# Patient Record
Sex: Female | Born: 1960 | Race: White | Hispanic: No | Smoking: Never smoker
Health system: Southern US, Community
[De-identification: ages and names within clinical notes are randomized; demographics above are authoritative.]

## PROBLEM LIST (undated history)

## (undated) DIAGNOSIS — M1990 Unspecified osteoarthritis, unspecified site: Secondary | ICD-10-CM

## (undated) DIAGNOSIS — R112 Nausea with vomiting, unspecified: Secondary | ICD-10-CM

## (undated) DIAGNOSIS — Z9889 Other specified postprocedural states: Secondary | ICD-10-CM

## (undated) DIAGNOSIS — C801 Malignant (primary) neoplasm, unspecified: Secondary | ICD-10-CM

## (undated) DIAGNOSIS — T8859XA Other complications of anesthesia, initial encounter: Secondary | ICD-10-CM

## (undated) DIAGNOSIS — T4145XA Adverse effect of unspecified anesthetic, initial encounter: Secondary | ICD-10-CM

## (undated) DIAGNOSIS — F40240 Claustrophobia: Secondary | ICD-10-CM

## (undated) DIAGNOSIS — K5900 Constipation, unspecified: Secondary | ICD-10-CM

## (undated) HISTORY — PX: NASAL SEPTUM SURGERY: SHX37

## (undated) HISTORY — PX: OVARIAN CYST SURGERY: SHX726

## (undated) HISTORY — PX: NECK SURGERY: SHX720

## (undated) HISTORY — PX: COLONOSCOPY: SHX174

---

## 2003-03-19 ENCOUNTER — Inpatient Hospital Stay (HOSPITAL_COMMUNITY): Admission: EM | Admit: 2003-03-19 | Discharge: 2003-03-20 | Payer: Self-pay | Admitting: Internal Medicine

## 2003-09-09 HISTORY — PX: BACK SURGERY: SHX140

## 2004-02-22 ENCOUNTER — Encounter: Admission: RE | Admit: 2004-02-22 | Discharge: 2004-02-22 | Payer: Self-pay | Admitting: Unknown Physician Specialty

## 2004-03-04 ENCOUNTER — Ambulatory Visit (HOSPITAL_COMMUNITY): Admission: RE | Admit: 2004-03-04 | Discharge: 2004-03-05 | Payer: Self-pay | Admitting: Neurosurgery

## 2007-10-21 ENCOUNTER — Other Ambulatory Visit: Admission: RE | Admit: 2007-10-21 | Discharge: 2007-10-21 | Payer: Self-pay | Admitting: Obstetrics and Gynecology

## 2011-01-24 NOTE — H&P (Signed)
[name]                           ACCOUNT NO.:  [id number]   MEDICAL RECORD NO.:  [id number]                   PATIENT TYPE:  INP   LOCATION:  IC06                                 FACILITY:  [facility]   PHYSICIAN:  Donna Bernard, M.D.             DATE OF BIRTH:  05-22-1961   DATE OF ADMISSION:  03/19/2003  DATE OF DISCHARGE:                                HISTORY & PHYSICAL   CHIEF COMPLAINT:  Drug overdose.   OBJECTIVE:  This patient is a 50 year old white female with a history of  prior depression and anxiety who presented to the emergency room the day of  admission, tremendously sedated and minimally responsive initially.  First  the family, then the patient reports that she had an argument with her  husband.  They may be in the midst of splitting up, she is not sure.  She is  silent when I ask her if she has every tried to hurt herself before.  She  took a number of Ambien anywhere between 5 and 8.  There were no other  medications taken according to the family.  The patient, initially, was  pretty much out of it upon arrival with observation and alertness has  improved somewhat.  Oxygen saturation has been in good range.  Blood  pressure has been good.  The patient was felt to be significantly too  sedated to refer this evening for mental health consultation.  Compliance of  medications is uncertain.  The patient is on Paxil 37.5 mg daily.   FAMILY HISTORY:  Positive for diabetes, coronary artery disease,  hyperlipidemia.   PAST SURGICAL HISTORY:  Cyst rupture and laser surgery of the cervical  region.   ALLERGIES:  None known.   SOCIAL HISTORY:  The patient was divorced, now remarried.  No children.   REVIEW OF SYSTEMS:  Otherwise, negative.   PHYSICAL EXAMINATION:  VITAL SIGNS:  Blood pressure 110/74, pulse 95,  afebrile.  GENERAL APPEARANCE:  Very sedated.  Initially would respond only to sternal  rub.  Now opens eyes to voice.  HEENT:  Pupils somewhat  dilated and slightly slow to respond.  Extraocular  muscles intact.  The patient is very sedated, will answer in a somewhat  slurred voice appropriately.  She is not alert.  She is oriented x3.  Pharynx normal.  NECK:  Supple.  LUNGS:  Clear.  HEART:  Regular rate and rhythm.  ABDOMEN:  Soft, good bowel sounds.  NEUROLOGICAL:  Deep tendon reflexes intact.  Sensation difficult to asses.  Strength fair.  Alertness is noted.   IMPRESSION:  1. Intentional drug overdose.  2. Significant sedation secondary to #1.  3. History of anxiety and depression.   PLAN:  Admit for close observation, support, IV fluids, etc.  Mental health  referral.  Further orders as noted in the chart.  Donna Bernard, M.D.    WSL/MEDQ  D:  03/19/2003  T:  03/20/2003  Job:  [id number]

## 2011-01-24 NOTE — Discharge Summary (Signed)
   Stacey Rowe                           ACCOUNT NO.:  [id number]   MEDICAL RECORD NO.:  [id number]                   PATIENT TYPE:  INP   LOCATION:  IC06                                 FACILITY:  [facility]   PHYSICIAN:  Scott A. Gerda Diss, M.D.               DATE OF BIRTH:  1961-02-18   DATE OF ADMISSION:  03/19/2003  DATE OF DISCHARGE:  03/20/2003                                 DISCHARGE SUMMARY   ADMISSION DIAGNOSIS:  Drug overdose.   A 50 year old female with anxiety took some medication spontaneously, mainly  because she was distraught over a social situation.  She now in retrospect  says she was not trying to hurt herself; she denies trying to kill herself.  She was seen by mental health evaluation services who did not feel she  needed to be committed.  She agreed to follow up for her regimented  counseling and she was of total alertness and normal orientation on March 20, 2003 and was stable for discharge and stated very definitively that she was  not trying to hurt herself.                                               Scott A. Gerda Diss, M.D.    SAL/MEDQ  D:  04/25/2003  T:  04/25/2003  Job:  [id number]

## 2011-01-24 NOTE — Op Note (Signed)
Stacey Rowe, APPLEMAN                          ACCOUNT NO.:  [id number]   MEDICAL RECORD NO.:  [id number]                   PATIENT TYPE:  OIB   LOCATION:  2873                                 FACILITY:  [facility]   PHYSICIAN:  Hewitt Shorts, M.D.            DATE OF BIRTH:  [birth year]   DATE OF PROCEDURE:  03/04/2004  DATE OF DISCHARGE:                                 OPERATIVE REPORT   PREOPERATIVE DIAGNOSIS:  Left L4-5 lumbar disk herniation, lumbar  degenerative disk disease, lumbar spondylosis and lumbar radiculopathy.   POSTOPERATIVE DIAGNOSIS:  Left L4-5 lumbar disk herniation, lumbar  degenerative disk disease, lumbar spondylosis and lumbar radiculopathy.   OPERATION PERFORMED:  Left L4-5 lumbar laminotomy and microdiskectomy.   SURGEON:  Hewitt Shorts, M.D.   ANESTHESIA:  General endotracheal.   DESCRIPTION OF PROCEDURE:  The patient was brought to the operating room and  placed under general endotracheal anesthesia.  The patient was turned to a  prone position.  Lumbar region was prepped with DuraPrepand draped in  sterile fashion.  The midline was infiltrated with local anesthetic with  epinephrine.  X-ray was taken and the L4-5 level identified and then a  midline incision was made over the L4-5 level and carried down to the  subcutaneous tissue.  Bipolar cautery and electrocautery were used to  maintain hemostasis.  Dissection was carried down to the lumbar fascia which  was incised on the right side of the midline in the paraspinal muscles.  We  dissected the spinous process and lamina in subperiosteal fashion.  The L4-5  interlaminar space was identified and another x-ray was taken to confirm the  localization and then the operating microscope was draped and brought into  the field to provide additional magnification, illumination and  visualization and the remainder of the decompression was performed using  microdissection and microsurgical technique. A  laminotomy was performed  using the X-Max drill and Kerrison punches.  The ligamentum flavum was  carefully removed and we identified the thecal sac and nerve root.  These  were gently mobilized medially exposing the disk herniation.  The remaining  annular fibers were incised and diskectomy begun.  We removed several  fragments of disk herniation as well as a large amount of spondylitically  protruding disk material and we were able to decompress the thecal sac and  nerve root.  In the end, all loose fragments of disk material were removed  from both disk space and the epidural space and good decompression was  achieved. Hemostasis was established with the use of bipolar cautery.  Once  hemostasis was established and the diskectomy completed, the wound was  irrigated with bacitracin solution and then 2 mL of fentanyl and 80 mg of  Depo-Medrol were infused into the epidural space and then we proceeded with  closure.  The deep fascia was closed with interrupted #1 undyed Vicryl  sutures, the subcutaneous  and subcuticular layer were closed with  interrupted inverted 2-0 undyed Vicryl sutures and skin edges were  reapproximated with Dermabond.  The patient tolerated the procedure well.  The estimated blood loss for this procedure was 25 mL.  Sponge, needle and  instrument counts were correct.  Following surgery the patient was turned  back to supine position to be reversed from anesthetic, extubated and  transferred to recovery room for further care.                                               Hewitt Shorts, M.D.   RWN/MEDQ  D:  03/04/2004  T:  03/04/2004  Job:  [id number]

## 2013-07-27 ENCOUNTER — Encounter: Payer: Self-pay | Admitting: Family Medicine

## 2013-07-27 ENCOUNTER — Ambulatory Visit (INDEPENDENT_AMBULATORY_CARE_PROVIDER_SITE_OTHER): Payer: BC Managed Care – PPO | Admitting: Family Medicine

## 2013-07-27 VITALS — BP 122/78 | Ht 66.75 in | Wt 164.8 lb

## 2013-07-27 DIAGNOSIS — M7711 Lateral epicondylitis, right elbow: Secondary | ICD-10-CM

## 2013-07-27 DIAGNOSIS — M7710 Lateral epicondylitis, unspecified elbow: Secondary | ICD-10-CM

## 2013-07-27 NOTE — Patient Instructions (Signed)
Tennis Elbow  Your caregiver has diagnosed you with a condition often referred to as "tennis elbow." This results from small tears or soreness (inflammation) at the start (origin) of the extensor muscles of the forearm. Although the condition is often called tennis or golfer's elbow, it is caused by any repetitive action performed by your elbow.  HOME CARE INSTRUCTIONS   If the condition has been short lived, rest may be the only treatment required. Using your opposite hand or arm to perform the task may help. Even changing your grip may help rest the extremity. These may even prevent the condition from recurring.   Longer standing problems, however, will often be relieved faster by:   Using anti-inflammatory agents.   Applying ice packs for 30 minutes at the end of the working day, at bed time, or when activities are finished.   Your caregiver may also have you wear a splint or sling. This will allow the inflamed tendon to heal.  At times, steroid injections aided with a local anesthetic will be required along with splinting for 1 to 2 weeks. Two to three steroid injections will often solve the problem. In some long standing cases, the inflamed tendon does not respond to conservative (non-surgical) therapy. Then surgery may be required to repair it.  MAKE SURE YOU:    Understand these instructions.   Will watch your condition.   Will get help right away if you are not doing well or get worse.  Document Released: 08/25/2005 Document Revised: 11/17/2011 Document Reviewed: 04/12/2008  [employer] Patient Information 2014 [employer].

## 2013-07-27 NOTE — Progress Notes (Signed)
  Subjective:    Patient ID: Stacey Rowe, female    DOB: [birth year], 52 y.o.   MRN: [id number]  HPI Patient arrives with right elbow pain off and on for a year.  Off and on pain. 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 Lungs clear heart regular in rhythm. Right lateral elbow tenderness palpation good range of motion no deformity       Assessment & Plan:  Impression lateral epicondylitis discussed at length plan Voltaren twice a day with food. Form strap. Local measures discussed proper economic set the wrist discussed in encourage no [place] a rationale discussed. WSL

## 2014-10-18 ENCOUNTER — Encounter: Payer: Self-pay | Admitting: Family Medicine

## 2014-10-18 ENCOUNTER — Ambulatory Visit (INDEPENDENT_AMBULATORY_CARE_PROVIDER_SITE_OTHER): Payer: BLUE CROSS/BLUE SHIELD | Admitting: Family Medicine

## 2014-10-18 VITALS — BP 122/82 | Temp 98.2°F | Ht 66.75 in | Wt 161.0 lb

## 2014-10-18 DIAGNOSIS — B9689 Other specified bacterial agents as the cause of diseases classified elsewhere: Secondary | ICD-10-CM

## 2014-10-18 DIAGNOSIS — J019 Acute sinusitis, unspecified: Secondary | ICD-10-CM

## 2014-10-18 MED ORDER — LEVOFLOXACIN 500 MG PO TABS: 500.0000 mg | ORAL_TABLET | Freq: Every day | ORAL | Status: DC

## 2014-10-18 NOTE — Progress Notes (Signed)
   Subjective:    Patient ID: Stacey Rowe, female    DOB: 06/03/1961, 54 y.o.   MRN: [id number]  Sinusitis This is a new problem. Episode onset: Feb 2  The problem has been gradually worsening since onset. Maximum temperature: low grade  The fever has been present for less than 1 day. Associated symptoms include congestion, coughing, headaches, sinus pressure and a sore throat. Pertinent negatives include no ear pain or shortness of breath. (Chest tightness ) Past treatments include antibiotics, acetaminophen and oral decongestants. The treatment provided mild relief.   Moderate sinus pressure and congestion in the chest as well   Review of Systems  Constitutional: Negative for fever and activity change.  HENT: Positive for congestion, rhinorrhea, sinus pressure and sore throat. Negative for ear pain.   Eyes: Negative for discharge.  Respiratory: Positive for cough. Negative for shortness of breath and wheezing.   Cardiovascular: Negative for chest pain.  Neurological: Positive for headaches.       Objective:   Physical Exam  Constitutional: She appears well-developed.  HENT:  Head: Normocephalic.  Nose: Nose normal.  Mouth/Throat: Oropharynx is clear and moist. No oropharyngeal exudate.  Neck: Neck supple.  Cardiovascular: Normal rate and normal heart sounds.   No murmur heard. Pulmonary/Chest: Effort normal and breath sounds normal. She has no wheezes.  Lymphadenopathy:    She has no cervical adenopathy.  Skin: Skin is warm and dry.  Nursing note and vitals reviewed.  Her lungs are nice and clear I don't find any evidence of pneumonia going on patient not toxic we will cover her sinuses with antibiotics      Assessment & Plan:  Acute sinusitis antibiotics prescribed warning signs discuss

## 2016-01-03 DIAGNOSIS — Z008 Encounter for other general examination: Secondary | ICD-10-CM | POA: Diagnosis not present

## 2016-01-03 DIAGNOSIS — K5900 Constipation, unspecified: Secondary | ICD-10-CM | POA: Diagnosis not present

## 2016-01-03 DIAGNOSIS — Z1389 Encounter for screening for other disorder: Secondary | ICD-10-CM | POA: Diagnosis not present

## 2016-03-04 DIAGNOSIS — Z139 Encounter for screening, unspecified: Secondary | ICD-10-CM | POA: Diagnosis not present

## 2016-03-14 DIAGNOSIS — M25511 Pain in right shoulder: Secondary | ICD-10-CM | POA: Diagnosis not present

## 2016-03-14 DIAGNOSIS — M5030 Other cervical disc degeneration, unspecified cervical region: Secondary | ICD-10-CM | POA: Diagnosis not present

## 2016-03-18 DIAGNOSIS — R7301 Impaired fasting glucose: Secondary | ICD-10-CM | POA: Diagnosis not present

## 2016-03-19 DIAGNOSIS — Z01419 Encounter for gynecological examination (general) (routine) without abnormal findings: Secondary | ICD-10-CM | POA: Diagnosis not present

## 2016-03-19 DIAGNOSIS — Z6824 Body mass index (BMI) 24.0-24.9, adult: Secondary | ICD-10-CM | POA: Diagnosis not present

## 2016-03-24 DIAGNOSIS — M25511 Pain in right shoulder: Secondary | ICD-10-CM | POA: Diagnosis not present

## 2016-03-24 DIAGNOSIS — M5030 Other cervical disc degeneration, unspecified cervical region: Secondary | ICD-10-CM | POA: Diagnosis not present

## 2016-03-26 ENCOUNTER — Encounter (HOSPITAL_COMMUNITY): Payer: Self-pay | Admitting: Occupational Therapy

## 2016-03-26 ENCOUNTER — Ambulatory Visit (HOSPITAL_COMMUNITY): Payer: BLUE CROSS/BLUE SHIELD | Attending: Sports Medicine | Admitting: Occupational Therapy

## 2016-03-26 DIAGNOSIS — M25511 Pain in right shoulder: Secondary | ICD-10-CM | POA: Insufficient documentation

## 2016-03-26 DIAGNOSIS — R29898 Other symptoms and signs involving the musculoskeletal system: Secondary | ICD-10-CM | POA: Insufficient documentation

## 2016-03-26 NOTE — Patient Instructions (Signed)
1) Seated Row   Sit up straight with elbows by your sides. Pull back with shoulders/elbows, keeping forearms straight, as if pulling back on the reins of a horse. Squeeze shoulder blades together. Repeat _10-15__times, _1-2___sets/day    2) Shoulder Elevation    Sit up straight with arms by your sides. Slowly bring your shoulders up towards your ears. Repeat_10-15__times, __1-2__ sets/day    3) Shoulder Extension    Sit up straight with both arms by your side, draw your arms back behind your waist. Keep your elbows straight. Repeat __10-15__times, _1-2___sets/day.        

## 2016-03-26 NOTE — Therapy (Signed)
[facility] [address] [place], [place], [address] Phone: [phone]   Fax:  [phone]  Occupational Therapy Evaluation  Patient Details  Name: Stacey Rowe MRN: [id number] Date of Birth: 1961/08/29 Referring Provider: Dr. Wandra Feinstein  Encounter Date: 03/26/2016      OT End of Session - 03/26/16 1335    Visit Number 1   Number of Visits 8   Date for OT Re-Evaluation 04/25/16   Authorization Type BCBS   OT Start Time 1118   OT Stop Time 1153   OT Time Calculation (min) 35 min   Activity Tolerance Patient tolerated treatment well   Behavior During Therapy [facility] for tasks assessed/performed      History reviewed. No pertinent past medical history.  No past surgical history on file.  There were no vitals filed for this visit.      Subjective Assessment - 03/26/16 1331    Subjective  S: I just don't use this arm at all.    Pertinent History Pt is a 55 y/o female presenting with right shoulder pain of unknown origin that began in January. Pt reports she had intermittent shoulder pain prior to January however it was not significant. Pt has had 2 cortisone shots between January and July, however continues to experience pain with movement of the RUE. Pt was referred to occupational therapy for evaluation and treatment by Dr. Wandra Feinstein.    Special Tests FOTO Score: 54/100   Patient Stated Goals To be able to use my right arm.    Currently in Pain? No/denies           [facility] OT Assessment - 03/26/16 1119    Assessment   Diagnosis Right shoulder pain   Referring Provider Dr. Wandra Feinstein   Onset Date 09/09/15   Prior Therapy None   Precautions   Precautions None   Restrictions   Weight Bearing Restrictions No   Balance Screen   Has the patient fallen in the past 6 months No   Has the patient had a decrease in activity level because of a fear of falling?  No   Is the patient reluctant to leave their home  because of a fear of falling?  No   Home  Environment   Family/patient expects to be discharged to: Private residence   Prior Function   Level of Independence Independent   Vocation Full time employment   Conservation officer, historic buildings work   Leisure Research scientist (life sciences) with grandkids, reading    ADL   ADL comments Pt is having difficulty using RUE with all daily tasks including dressing, bathing-washing hair, household tasks, reaching and lifting lightweight objects   Written Expression   Dominant Hand Right   ROM / Strength   AROM / PROM / Strength AROM;PROM;Strength   Palpation   Palpation comment Moderate fascial restrictions in right upper arm and trapezius regions   AROM   Overall AROM Comments Assessed seated, ER/IR adducted   AROM Assessment Site Shoulder   Right/Left Shoulder Right   Right Shoulder Flexion 143 Degrees   Right Shoulder ABduction 155 Degrees   Right Shoulder Internal Rotation 90 Degrees   Right Shoulder External Rotation 52 Degrees   PROM   Overall PROM Comments Assessed in supine, ER/IR adducted   PROM Assessment Site Shoulder   Right/Left Shoulder Right   Right Shoulder Flexion 156 Degrees   Right Shoulder ABduction 130 Degrees   Right Shoulder Internal Rotation 90 Degrees   Right  Shoulder External Rotation 54 Degrees   Strength   Overall Strength Comments Assessed seated, ER/IR adducted   Strength Assessment Site Shoulder   Right/Left Shoulder Right   Right Shoulder Flexion 4/5   Right Shoulder ABduction 4/5   Right Shoulder Internal Rotation 4/5   Right Shoulder External Rotation 4-/5                         OT Education - 03/26/16 1145    Education provided Yes   Education Details scapular A/ROM   Person(s) Educated Patient   Methods Explanation;Demonstration;Handout   Comprehension Verbalized understanding;Returned demonstration          OT Short Term Goals - 03/26/16 1343    OT SHORT TERM GOAL #1   Title Pt will be  educated on HEP.    Time 4   Period Weeks   Status New   OT SHORT TERM GOAL #2   Title Pt will return to prior level of functioning and independence in all daily tasks using RUE as dominant.    Time 4   Period Weeks   Status New   OT SHORT TERM GOAL #3   Title Pt will decrease RUE pain to 3/10 or less with use during daily tasks.    Time 4   Period Weeks   Status New   OT SHORT TERM GOAL #4   Title Pt will decrease fascial restrictions in RUE to min amounts or less to increase mobility of RUE during functional tasks.    Time 4   Period Weeks   Status New   OT SHORT TERM GOAL #5   Title Pt will increase A/ROM of RUE to WNL to improve ability to wash hair using RUE.    Time 4   Period Weeks   Status New   Additional Short Term Goals   Additional Short Term Goals Yes   OT SHORT TERM GOAL #6   Title Pt will increase RUE strength to 4+/5 to increase ability to use RUE as dominant when completing yardwork tasks.    Time 4   Period Weeks   Status New                  Plan - 03/26/16 1335    Clinical Impression Statement A: Pt is a 55 y/o female presenting with increased pain and fascial restrictions, decreased range of motion and strength in RUE limiting ability to use as dominant during daily tasks. Pt is now completing all daily tasks with LUE due to pain with movement and use of RUE. Pt uses ice every night for pain management.    Rehab Potential Good   OT Frequency 2x / week   OT Duration 4 weeks   OT Treatment/Interventions Self-care/ADL training;Passive range of motion;Patient/family education;Cryotherapy;Electrical Stimulation;Moist Heat;Therapeutic exercise;Manual Therapy;Therapeutic activities   Plan P: Pt will benefit from skilled occupational therapy services to decrease pain and fascial restrictions, increase range of motion, strength, and functional use of RUE. Treatment plan: Myofascial release, manual therapy, P/ROM, A/ROM, general RUE strengthening, scapular  stability and strengthening, modalities as needed.    OT Home Exercise Plan scapular A/ROM exercises   Consulted and Agree with Plan of Care Patient      Patient will benefit from skilled therapeutic intervention in order to improve the following deficits and impairments:  Decreased strength, Pain, Impaired UE functional use, Increased fascial restricitons, Decreased range of motion, Impaired flexibility  Visit Diagnosis: Pain in  right shoulder  Other symptoms and signs involving the musculoskeletal system    Problem List Patient Active Problem List   Diagnosis Date Noted  . Lateral epicondylitis of right elbow 07/27/2013    Guadelupe Sabin, OTR/L  [phone]  03/26/2016, 1:47 PM  [facility] [address] [place], [place], [address] Phone: [phone]   Fax:  [phone]  Name: BREEANA FUGUA MRN: [id number] Date of Birth: 1960/10/02

## 2016-03-28 ENCOUNTER — Ambulatory Visit (HOSPITAL_COMMUNITY): Payer: BLUE CROSS/BLUE SHIELD | Admitting: Occupational Therapy

## 2016-03-28 ENCOUNTER — Encounter (HOSPITAL_COMMUNITY): Payer: Self-pay | Admitting: Occupational Therapy

## 2016-03-28 DIAGNOSIS — M25511 Pain in right shoulder: Secondary | ICD-10-CM | POA: Diagnosis not present

## 2016-03-28 DIAGNOSIS — R29898 Other symptoms and signs involving the musculoskeletal system: Secondary | ICD-10-CM

## 2016-03-28 NOTE — Therapy (Signed)
[facility] [place], [place], [address] Phone: [phone]   Fax:  [phone]  Occupational Therapy Treatment  Patient Details  Name: BARBIE GOVERT MRN: [id number] Date of Birth: August 25, 1961 Referring Provider: Dr. Wandra Feinstein  Encounter Date: 03/28/2016      OT End of Session - 03/28/16 1156    Visit Number 2   Number of Visits 8   Date for OT Re-Evaluation 04/25/16   Authorization Type BCBS   OT Start Time 1115   OT Stop Time 1155   OT Time Calculation (min) 40 min   Activity Tolerance Patient tolerated treatment well   Behavior During Therapy [facility] for tasks assessed/performed      History reviewed. No pertinent past medical history.  No past surgical history on file.  There were no vitals filed for this visit.      Subjective Assessment - 03/28/16 1117    Subjective  S: That exercise where you pull back hurts me.    Currently in Pain? No/denies            [facility] OT Assessment - 03/28/16 1117    Assessment   Diagnosis Right shoulder pain   Precautions   Precautions None                  OT Treatments/Exercises (OP) - 03/28/16 1118    Exercises   Exercises Shoulder   Shoulder Exercises: Supine   Protraction PROM;5 reps;AROM;10 reps   Horizontal ABduction PROM;5 reps;AROM;10 reps   External Rotation PROM;5 reps;AROM;10 reps   Internal Rotation PROM;5 reps;AROM;10 reps   Flexion PROM;5 reps;AROM;10 reps   ABduction PROM;5 reps;AROM;10 reps   Other Supine Exercises Serratus anterior punches 10X   Shoulder Exercises: Seated   Elevation AROM;10 reps   Extension AROM;10 reps   Row AROM;10 reps   Manual Therapy   Manual Therapy Myofascial release   Manual therapy comments Manual therapy completed prior to therapeutic exercise   Myofascial Release Myofascial release to right upper arm, trapezius, and scapularis regions to decrease pain and fascial restrictions and increase joint range  of motion                OT Education - 03/28/16 1155    Education provided Yes   Education Details A/ROM exercises-supine   Person(s) Educated Patient   Methods Explanation;Demonstration;Handout   Comprehension Verbalized understanding;Returned demonstration          OT Short Term Goals - 03/28/16 1158    OT SHORT TERM GOAL #1   Title Pt will be educated on HEP.    Time 4   Period Weeks   Status On-going   OT SHORT TERM GOAL #2   Title Pt will return to prior level of functioning and independence in all daily tasks using RUE as dominant.    Time 4   Period Weeks   Status On-going   OT SHORT TERM GOAL #3   Title Pt will decrease RUE pain to 3/10 or less with use during daily tasks.    Time 4   Period Weeks   Status On-going   OT SHORT TERM GOAL #4   Title Pt will decrease fascial restrictions in RUE to min amounts or less to increase mobility of RUE during functional tasks.    Time 4   Period Weeks   Status On-going   OT SHORT TERM GOAL #5   Title Pt will increase A/ROM of RUE to WNL to  improve ability to wash hair using RUE.    Time 4   Period Weeks   Status On-going   OT SHORT TERM GOAL #6   Title Pt will increase RUE strength to 4+/5 to increase ability to use RUE as dominant when completing yardwork tasks.    Time 4   Period Weeks   Status On-going                  Plan - 03/28/16 1156    Clinical Impression Statement A: Initiated myofascial release, manual therapy, P/ROM, A/ROM and therapy ball stretches this session. Pt reports minimal pain with A/ROM row. Pt with P/ROM and A/ROM WNL this session, verbal cuing for form and technique during exercises. Pt provided with A/ROM HEP to continue while on vacation this week.    Rehab Potential Good   OT Frequency 2x / week   OT Duration 4 weeks   OT Treatment/Interventions Self-care/ADL training;Passive range of motion;Patient/family education;Cryotherapy;Electrical Stimulation;Moist  Heat;Therapeutic exercise;Manual Therapy;Therapeutic activities   Plan P: Follow up on A/ROM, add exercises in standing. Add scapular theraband for scapular stability if pt able to tolerate.       Patient will benefit from skilled therapeutic intervention in order to improve the following deficits and impairments:  Decreased strength, Pain, Impaired UE functional use, Increased fascial restricitons, Decreased range of motion, Impaired flexibility  Visit Diagnosis: Pain in right shoulder  Other symptoms and signs involving the musculoskeletal system    Problem List Patient Active Problem List   Diagnosis Date Noted  . Lateral epicondylitis of right elbow 07/27/2013    Guadelupe Sabin, OTR/L  [phone] 03/28/2016, 11:58 AM  [facility] [address] [place], [place], [address] Phone: [phone]   Fax:  [phone]  Name: TANNYA SPRUILL MRN: [id number] Date of Birth: 18-Sep-1960

## 2016-03-28 NOTE — Patient Instructions (Signed)

## 2016-04-04 ENCOUNTER — Ambulatory Visit (HOSPITAL_COMMUNITY): Payer: BLUE CROSS/BLUE SHIELD

## 2016-04-04 ENCOUNTER — Encounter (HOSPITAL_COMMUNITY): Payer: Self-pay

## 2016-04-04 DIAGNOSIS — M25511 Pain in right shoulder: Secondary | ICD-10-CM | POA: Diagnosis not present

## 2016-04-04 DIAGNOSIS — R29898 Other symptoms and signs involving the musculoskeletal system: Secondary | ICD-10-CM | POA: Diagnosis not present

## 2016-04-04 NOTE — Therapy (Signed)
[place], [place], [address] Phone: [phone]   Fax:  [phone]  Occupational Therapy Treatment  Patient Details  Name: Stacey Rowe MRN: [id number] Date of Birth: [birth year] Referring Provider: Dr. Wandra Feinstein  Encounter Date: 04/04/2016      OT End of Session - 04/04/16 1055    Visit Number 3   Number of Visits 8   Date for OT Re-Evaluation 04/25/16   Authorization Type BCBS   OT Start Time [phone]   OT Stop Time 1030   OT Time Calculation (min) 42 min   Activity Tolerance Patient tolerated treatment well   Behavior During Therapy [facility] for tasks assessed/performed      History reviewed. No pertinent past medical history.  No past surgical history on file.  There were no vitals filed for this visit.      Subjective Assessment - 04/04/16 1009    Subjective  S: I did my exercises while I was on vacation. I did them in the pool.   Currently in Pain? Yes   Pain Score 2    Pain Location Shoulder   Pain Orientation Right   Pain Descriptors / Indicators Aching   Pain Type Acute pain   Pain Radiating Towards Down to deltoid   Pain Onset Yesterday   Pain Frequency Occasional   Aggravating Factors  Use and movement   Pain Relieving Factors Rest   Effect of Pain on Daily Activities None   Multiple Pain Sites No            [facility] OT Assessment - 04/04/16 1004      Assessment   Diagnosis Right shoulder pain     Precautions   Precautions None                  OT Treatments/Exercises (OP) - 04/04/16 1004      Exercises   Exercises Shoulder     Shoulder Exercises: Supine   Protraction PROM;5 reps;AROM;15 reps   Horizontal ABduction PROM;5 reps;AROM;15 reps   External Rotation PROM;5 reps;AROM;15 reps   Internal Rotation PROM;5 reps;AROM;15 reps   Flexion PROM;5 reps;AROM;15 reps   ABduction PROM;5 reps;AROM;15 reps     Shoulder Exercises: Standing   Protraction AROM;12  reps   Horizontal ABduction AROM;12 reps   External Rotation AROM;12 reps   Internal Rotation AROM;12 reps   Flexion AROM;12 reps   ABduction AROM;12 reps   Extension AROM;10 reps   Row AROM;10 reps   Shoulder Elevation AROM;10 reps     Shoulder Exercises: ROM/Strengthening   Proximal Shoulder Strengthening, Supine 10X no rest breaks     Modalities   Modalities Cryotherapy     Cryotherapy   Number Minutes Cryotherapy 10 Minutes   Cryotherapy Location Shoulder   Type of Cryotherapy Ice pack     Manual Therapy   Manual Therapy Myofascial release   Manual therapy comments Manual therapy completed prior to therapeutic exercise   Myofascial Release Myofascial release to right upper arm, trapezius, and scapularis regions to decrease pain and fascial restrictions and increase joint range of motion                OT Education - 04/04/16 1057    Education provided Yes   Education Details Recommended that patient continue completing HEP (A/ROM ) at home standing.    Person(s) Educated Patient   Methods Explanation   Comprehension Verbalized understanding  OT Short Term Goals - 03/28/16 1158      OT SHORT TERM GOAL #1   Title Pt will be educated on HEP.    Time 4   Period Weeks   Status On-going     OT SHORT TERM GOAL #2   Title Pt will return to prior level of functioning and independence in all daily tasks using RUE as dominant.    Time 4   Period Weeks   Status On-going     OT SHORT TERM GOAL #3   Title Pt will decrease RUE pain to 3/10 or less with use during daily tasks.    Time 4   Period Weeks   Status On-going     OT SHORT TERM GOAL #4   Title Pt will decrease fascial restrictions in RUE to min amounts or less to increase mobility of RUE during functional tasks.    Time 4   Period Weeks   Status On-going     OT SHORT TERM GOAL #5   Title Pt will increase A/ROM of RUE to WNL to improve ability to wash hair using RUE.    Time 4   Period  Weeks   Status On-going     OT SHORT TERM GOAL #6   Title Pt will increase RUE strength to 4+/5 to increase ability to use RUE as dominant when completing yardwork tasks.    Time 4   Period Weeks   Status On-going                  Plan - 04/04/16 1056    Clinical Impression Statement A: Pt completing exercises for HEP without issue. Added A/ROM standing. Unable to add scapular theraband due to time constraint. patient reports increased pain and burning and anterior deltoid region. Ice applied at end of session for pain management.    Plan P: Add scapular theraband for scapular stability if able to tolerate.       Patient will benefit from skilled therapeutic intervention in order to improve the following deficits and impairments:  Decreased strength, Pain, Impaired UE functional use, Increased fascial restricitons, Decreased range of motion, Impaired flexibility  Visit Diagnosis: Pain in right shoulder  Other symptoms and signs involving the musculoskeletal system    Problem List Patient Active Problem List   Diagnosis Date Noted  . Lateral epicondylitis of right elbow 07/27/2013   Ailene Ravel, OTR/L,CBIS  [phone]  04/04/2016, 11:04 AM  [facility] [address] [place], [place], [address] Phone: [phone]   Fax:  [phone]  Name: Stacey Rowe MRN: [id number] Date of Birth: 11/22/1960

## 2016-04-08 ENCOUNTER — Ambulatory Visit (HOSPITAL_COMMUNITY): Payer: BLUE CROSS/BLUE SHIELD | Attending: Sports Medicine

## 2016-04-08 ENCOUNTER — Encounter (HOSPITAL_COMMUNITY): Payer: Self-pay

## 2016-04-08 DIAGNOSIS — R29898 Other symptoms and signs involving the musculoskeletal system: Secondary | ICD-10-CM | POA: Diagnosis not present

## 2016-04-08 DIAGNOSIS — M25511 Pain in right shoulder: Secondary | ICD-10-CM | POA: Diagnosis not present

## 2016-04-08 NOTE — Patient Instructions (Signed)

## 2016-04-08 NOTE — Therapy (Signed)
[place], [place], [address] Phone: [phone]   Fax:  [phone]  Occupational Therapy Treatment  Patient Details  Name: Stacey Rowe MRN: [id number] Date of Birth: Mar 19, 1961 Referring Provider: Dr. Wandra Feinstein  Encounter Date: 04/08/2016      OT End of Session - 04/08/16 1157    Visit Number 4   Number of Visits 8   Date for OT Re-Evaluation 04/25/16   Authorization Type BCBS   OT Start Time 1117   OT Stop Time 1200   OT Time Calculation (min) 43 min   Activity Tolerance Patient tolerated treatment well   Behavior During Therapy [facility] for tasks assessed/performed      History reviewed. No pertinent past medical history.  No past surgical history on file.  There were no vitals filed for this visit.      Subjective Assessment - 04/08/16 1142    Subjective  S: Saturday was the best day. I didn't have any pain until later in the day.   Currently in Pain? Yes   Pain Score 1    Pain Location Shoulder   Pain Orientation Right   Pain Descriptors / Indicators Tender;Sore   Pain Type Acute pain                      OT Treatments/Exercises (OP) - 04/08/16 1133      Exercises   Exercises Shoulder     Shoulder Exercises: Supine   Protraction PROM;5 reps;AROM;15 reps   Horizontal ABduction PROM;5 reps;AROM;15 reps   External Rotation PROM;5 reps;AROM;15 reps   Internal Rotation PROM;5 reps;AROM;15 reps   Flexion PROM;5 reps;AROM;15 reps   ABduction PROM;5 reps;AROM;15 reps     Shoulder Exercises: Standing   Protraction AROM;15 reps   Horizontal ABduction AROM;15 reps   External Rotation AROM;15 reps   Internal Rotation AROM;15 reps   Flexion AROM;15 reps   ABduction AROM;15 reps   Extension Theraband;12 reps   Theraband Level (Shoulder Extension) Level 2 (Red)   Row Theraband;12 reps   Theraband Level (Shoulder Row) Level 2 (Red)   Retraction Theraband;12 reps   Theraband Level (Shoulder Retraction) Level 2 (Red)     Shoulder Exercises: ROM/Strengthening   UBE (Upper Arm Bike) Level 1' 2' flexion 2' reverse   Proximal Shoulder Strengthening, Supine 15X no rest breaks     Shoulder Exercises: Stretch   Corner Stretch 2 reps;10 seconds  doorway     Manual Therapy   Manual Therapy Myofascial release   Manual therapy comments Manual therapy completed prior to therapeutic exercise   Myofascial Release Myofascial release to right upper arm, trapezius, and scapularis regions to decrease pain and fascial restrictions and increase joint range of motion                OT Education - 04/08/16 1156    Education provided Yes   Education Details scapular theraband exercises (red)   Person(s) Educated Patient   Methods Explanation;Demonstration;Verbal cues;Handout   Comprehension Returned demonstration;Verbalized understanding          OT Short Term Goals - 03/28/16 1158      OT SHORT TERM GOAL #1   Title Pt will be educated on HEP.    Time 4   Period Weeks   Status On-going     OT SHORT TERM GOAL #2   Title Pt will return to prior level of functioning and independence in all daily tasks using  RUE as dominant.    Time 4   Period Weeks   Status On-going     OT SHORT TERM GOAL #3   Title Pt will decrease RUE pain to 3/10 or less with use during daily tasks.    Time 4   Period Weeks   Status On-going     OT SHORT TERM GOAL #4   Title Pt will decrease fascial restrictions in RUE to min amounts or less to increase mobility of RUE during functional tasks.    Time 4   Period Weeks   Status On-going     OT SHORT TERM GOAL #5   Title Pt will increase A/ROM of RUE to WNL to improve ability to wash hair using RUE.    Time 4   Period Weeks   Status On-going     OT SHORT TERM GOAL #6   Title Pt will increase RUE strength to 4+/5 to increase ability to use RUE as dominant when completing yardwork tasks.    Time 4   Period Weeks    Status On-going                  Plan - 04/08/16 1201    Clinical Impression Statement A: Added scapular theraband exercises and patient completed with great form. Exercises added to HEP. Pt reports that this session her shoulder felt stronger and stable versus last session.    Plan P: Continue with A/ROM exercises. Increase to 12 repetitions if able.       Patient will benefit from skilled therapeutic intervention in order to improve the following deficits and impairments:  Decreased strength, Pain, Impaired UE functional use, Increased fascial restricitons, Decreased range of motion, Impaired flexibility  Visit Diagnosis: Other symptoms and signs involving the musculoskeletal system  Pain in right shoulder    Problem List Patient Active Problem List   Diagnosis Date Noted  . Lateral epicondylitis of right elbow 07/27/2013   Ailene Ravel, OTR/L,CBIS  [phone]  04/08/2016, 12:42 PM  [facility] [address] [place], [place], [address] Phone: [phone]   Fax:  [phone]  Name: Stacey Rowe MRN: [id number] Date of Birth: 1960-09-20

## 2016-04-10 ENCOUNTER — Encounter (HOSPITAL_COMMUNITY): Payer: Self-pay | Admitting: Occupational Therapy

## 2016-04-10 ENCOUNTER — Ambulatory Visit (HOSPITAL_COMMUNITY): Payer: BLUE CROSS/BLUE SHIELD | Admitting: Occupational Therapy

## 2016-04-10 DIAGNOSIS — R29898 Other symptoms and signs involving the musculoskeletal system: Secondary | ICD-10-CM | POA: Diagnosis not present

## 2016-04-10 DIAGNOSIS — M25511 Pain in right shoulder: Secondary | ICD-10-CM

## 2016-04-10 NOTE — Therapy (Signed)
[place], [place], [address] Phone: [phone]   Fax:  [phone]  Occupational Therapy Treatment  Patient Details  Name: Stacey Rowe MRN: [id number] Date of Birth: 30-Mar-1961 Referring Provider: Dr. Wandra Feinstein  Encounter Date: 04/10/2016      OT End of Session - 04/10/16 1241    Visit Number 5   Number of Visits 8   Date for OT Re-Evaluation 04/25/16   Authorization Type BCBS   OT Start Time 1115   OT Stop Time 1156   OT Time Calculation (min) 41 min   Activity Tolerance Patient tolerated treatment well   Behavior During Therapy [facility] for tasks assessed/performed      History reviewed. No pertinent past medical history.  No past surgical history on file.  There were no vitals filed for this visit.      Subjective Assessment - 04/10/16 1111    Subjective  S: Walking through the plant I felt a little pain throbbing in that arm.    Currently in Pain? No/denies            [facility] OT Assessment - 04/10/16 1111      Assessment   Diagnosis Right shoulder pain     Precautions   Precautions None                  OT Treatments/Exercises (OP) - 04/10/16 1119      Exercises   Exercises Shoulder     Shoulder Exercises: Supine   Protraction PROM;5 reps;AROM;15 reps   Horizontal ABduction PROM;5 reps;AROM;15 reps   External Rotation PROM;5 reps;AROM;15 reps   Internal Rotation PROM;5 reps;AROM;15 reps   Flexion PROM;5 reps;AROM;15 reps   ABduction PROM;5 reps;AROM;15 reps     Shoulder Exercises: Standing   Protraction AROM;15 reps;Theraband;10 reps   Theraband Level (Shoulder Protraction) Level 2 (Red)   Horizontal ABduction AROM;15 reps;Theraband;10 reps   Theraband Level (Shoulder Horizontal ABduction) Level 2 (Red)   External Rotation AROM;15 reps;Theraband;10 reps   Theraband Level (Shoulder External Rotation) Level 2 (Red)   Internal Rotation AROM;15 reps;Theraband;10  reps   Theraband Level (Shoulder Internal Rotation) Level 2 (Red)   Flexion AROM;15 reps;Theraband;10 reps   Theraband Level (Shoulder Flexion) Level 2 (Red)   ABduction AROM;15 reps   Extension Theraband;12 reps   Theraband Level (Shoulder Extension) Level 2 (Red)   Row Theraband;12 reps   Theraband Level (Shoulder Row) Level 2 (Red)   Retraction Theraband;12 reps   Theraband Level (Shoulder Retraction) Level 2 (Red)     Shoulder Exercises: ROM/Strengthening   X to V Arms 10X   Proximal Shoulder Strengthening, Supine 15X no rest breaks   Proximal Shoulder Strengthening, Seated 10X each no rest breaks   Ball on Wall 1' flexion 1' abduction   Rhythmic Stabilization, Supine Completed with shoulder at 60, 90, and 120 degrees, min difficulty. 25X     Manual Therapy   Manual Therapy Myofascial release   Manual therapy comments Manual therapy completed prior to therapeutic exercise   Myofascial Release Myofascial release to right upper arm, trapezius, and scapularis regions to decrease pain and fascial restrictions and increase joint range of motion                  OT Short Term Goals - 03/28/16 1158      OT SHORT TERM GOAL #1   Title Pt will be educated on HEP.    Time 4   Period  Weeks   Status On-going     OT SHORT TERM GOAL #2   Title Pt will return to prior level of functioning and independence in all daily tasks using RUE as dominant.    Time 4   Period Weeks   Status On-going     OT SHORT TERM GOAL #3   Title Pt will decrease RUE pain to 3/10 or less with use during daily tasks.    Time 4   Period Weeks   Status On-going     OT SHORT TERM GOAL #4   Title Pt will decrease fascial restrictions in RUE to min amounts or less to increase mobility of RUE during functional tasks.    Time 4   Period Weeks   Status On-going     OT SHORT TERM GOAL #5   Title Pt will increase A/ROM of RUE to WNL to improve ability to wash hair using RUE.    Time 4   Period Weeks    Status On-going     OT SHORT TERM GOAL #6   Title Pt will increase RUE strength to 4+/5 to increase ability to use RUE as dominant when completing yardwork tasks.    Time 4   Period Weeks   Status On-going                  Plan - 04/10/16 1241    Clinical Impression Statement A: Added theraband shoulder exercises this session, pt with no pain during exercises, some fatigue at end. Good form for all exercises, intermittent verbal cuing for initial completion. Pt reports she has occasional pain between visits.    Rehab Potential Good   OT Frequency 2x / week   OT Duration 4 weeks   OT Treatment/Interventions Self-care/ADL training;Passive range of motion;Patient/family education;Cryotherapy;Electrical Stimulation;Moist Heat;Therapeutic exercise;Manual Therapy;Therapeutic activities   Plan P: Add sidelying A/ROM, follow up on scapular theraband HEP if pt has attempted.    Consulted and Agree with Plan of Care Patient      Patient will benefit from skilled therapeutic intervention in order to improve the following deficits and impairments:  Decreased strength, Pain, Impaired UE functional use, Increased fascial restricitons, Decreased range of motion, Impaired flexibility  Visit Diagnosis: Other symptoms and signs involving the musculoskeletal system  Pain in right shoulder    Problem List Patient Active Problem List   Diagnosis Date Noted  . Lateral epicondylitis of right elbow 07/27/2013    Guadelupe Sabin, OTR/L  [phone] 04/10/2016, 12:43 PM  [facility] [address] [place], [place], [address] Phone: [phone]   Fax:  [phone]  Name: Stacey Rowe MRN: [id number] Date of Birth: August 18, 1961

## 2016-04-15 ENCOUNTER — Encounter (HOSPITAL_COMMUNITY): Payer: Self-pay

## 2016-04-15 ENCOUNTER — Ambulatory Visit (HOSPITAL_COMMUNITY): Payer: BLUE CROSS/BLUE SHIELD

## 2016-04-15 DIAGNOSIS — M25511 Pain in right shoulder: Secondary | ICD-10-CM | POA: Diagnosis not present

## 2016-04-15 DIAGNOSIS — R29898 Other symptoms and signs involving the musculoskeletal system: Secondary | ICD-10-CM | POA: Diagnosis not present

## 2016-04-15 NOTE — Therapy (Signed)
[place], [place], [address] Phone: [phone]   Fax:  [phone]  Occupational Therapy Treatment  Patient Details  Name: Stacey Rowe MRN: [id number] Date of Birth: [birth year] Referring Provider: Dr. Wandra Feinstein  Encounter Date: 04/15/2016      OT End of Session - 04/15/16 0944    Visit Number 6   Number of Visits 8   Date for OT Re-Evaluation 04/25/16   Authorization Type BCBS   OT Start Time 0900   OT Stop Time 0940   OT Time Calculation (min) 40 min   Activity Tolerance Patient tolerated treatment well   Behavior During Therapy [facility] for tasks assessed/performed      History reviewed. No pertinent past medical history.  No past surgical history on file.  There were no vitals filed for this visit.      Subjective Assessment - 04/15/16 0921    Subjective  S: this weekend it was very painful. I didn't do anything different.    Currently in Pain? Yes   Pain Score 2    Pain Location Shoulder   Pain Orientation Right   Pain Descriptors / Indicators Tender;Sore   Pain Type Acute pain   Pain Radiating Towards down arm   Pain Onset In the past 7 days   Pain Frequency Occasional   Aggravating Factors  use and movement   Pain Relieving Factors rest    Effect of Pain on Daily Activities None   Multiple Pain Sites No            [facility] OT Assessment - 04/15/16 0924      Assessment   Diagnosis Right shoulder pain     Precautions   Precautions None                  OT Treatments/Exercises (OP) - 04/15/16 0924      Exercises   Exercises Shoulder     Shoulder Exercises: Supine   Protraction PROM;5 reps;AROM;15 reps   Horizontal ABduction PROM;5 reps;AROM;15 reps   External Rotation PROM;5 reps;AROM;15 reps   Internal Rotation PROM;5 reps;AROM;15 reps   Flexion PROM;5 reps;AROM;15 reps   ABduction PROM;5 reps;AROM;15 reps     Shoulder Exercises: Sidelying   External  Rotation AROM;12 reps   Internal Rotation AROM;12 reps   Flexion AROM;12 reps   ABduction AROM;12 reps     Shoulder Exercises: Standing   Protraction AROM;15 reps   Horizontal ABduction AROM;15 reps   External Rotation AROM;15 reps   Internal Rotation AROM;15 reps   Flexion AROM;15 reps   ABduction AROM;15 reps     Shoulder Exercises: ROM/Strengthening   Other ROM/Strengthening Exercises shoulder rolls backward/forward 12X     Manual Therapy   Manual Therapy Myofascial release   Manual therapy comments Manual therapy completed prior to therapeutic exercise   Myofascial Release Myofascial release to right upper arm, trapezius, and scapularis regions to decrease pain and fascial restrictions and increase joint range of motion                  OT Short Term Goals - 03/28/16 1158      OT SHORT TERM GOAL #1   Title Pt will be educated on HEP.    Time 4   Period Weeks   Status On-going     OT SHORT TERM GOAL #2   Title Pt will return to prior level of functioning and independence in all daily tasks using RUE  as dominant.    Time 4   Period Weeks   Status On-going     OT SHORT TERM GOAL #3   Title Pt will decrease RUE pain to 3/10 or less with use during daily tasks.    Time 4   Period Weeks   Status On-going     OT SHORT TERM GOAL #4   Title Pt will decrease fascial restrictions in RUE to min amounts or less to increase mobility of RUE during functional tasks.    Time 4   Period Weeks   Status On-going     OT SHORT TERM GOAL #5   Title Pt will increase A/ROM of RUE to WNL to improve ability to wash hair using RUE.    Time 4   Period Weeks   Status On-going     OT SHORT TERM GOAL #6   Title Pt will increase RUE strength to 4+/5 to increase ability to use RUE as dominant when completing yardwork tasks.    Time 4   Period Weeks   Status On-going                  Plan - 04/15/16 0944    Clinical Impression Statement A: Pt completed sidelying  exercises to increase scapular stability and strength. Greatest difficulty with sidelying flexion. VC for form and technique as needed.    Plan P: Reassess/measurements for MD appointment on Monday. Patient would like to wait and speak with MD before scheduling more appointments.       Patient will benefit from skilled therapeutic intervention in order to improve the following deficits and impairments:  Decreased strength, Pain, Impaired UE functional use, Increased fascial restricitons, Decreased range of motion, Impaired flexibility  Visit Diagnosis: Other symptoms and signs involving the musculoskeletal system  Pain in right shoulder    Problem List Patient Active Problem List   Diagnosis Date Noted  . Lateral epicondylitis of right elbow 07/27/2013   Ailene Ravel, OTR/L,CBIS  [phone]  04/15/2016, 9:46 AM  [facility] [address] [place], [place], [address] Phone: [phone]   Fax:  [phone]  Name: Stacey Rowe MRN: [id number] Date of Birth: 22-May-1961

## 2016-04-18 ENCOUNTER — Encounter (HOSPITAL_COMMUNITY): Payer: Self-pay | Admitting: Occupational Therapy

## 2016-04-18 ENCOUNTER — Ambulatory Visit (HOSPITAL_COMMUNITY): Payer: BLUE CROSS/BLUE SHIELD | Admitting: Occupational Therapy

## 2016-04-18 DIAGNOSIS — R29898 Other symptoms and signs involving the musculoskeletal system: Secondary | ICD-10-CM

## 2016-04-18 DIAGNOSIS — M25511 Pain in right shoulder: Secondary | ICD-10-CM | POA: Diagnosis not present

## 2016-04-18 NOTE — Therapy (Signed)
[place], [place], [address] Phone: [phone]   Fax:  [phone]  Occupational Therapy Reassessment, Treatment, and Discharge  Patient Details  Name: Stacey Rowe MRN: [id number] Date of Birth: 25-Oct-1960 Referring Provider: Dr. Wandra Feinstein  Encounter Date: 04/18/2016      OT End of Session - 04/18/16 1157    Visit Number 7   Number of Visits 8   Date for OT Re-Evaluation 04/25/16   Authorization Type BCBS   OT Start Time 1114   OT Stop Time 1157   OT Time Calculation (min) 43 min   Activity Tolerance Patient tolerated treatment well   Behavior During Therapy [facility] for tasks assessed/performed      History reviewed. No pertinent past medical history.  No past surgical history on file.  There were no vitals filed for this visit.      Subjective Assessment - 04/18/16 1113    Subjective  S: I'm having a good day today. No pain yet.    Currently in Pain? No/denies           [facility] OT Assessment - 04/18/16 1113      Assessment   Diagnosis Right shoulder pain     Precautions   Precautions None     ROM / Strength   AROM / PROM / Strength AROM;PROM;Strength     Palpation   Palpation comment Minimal fascial restrictions in right upper arm and trapezius regions     AROM   Overall AROM Comments Assessed seated, ER/IR adducted   AROM Assessment Site Shoulder   Right/Left Shoulder Right   Right Shoulder Flexion 174 Degrees  143 previous   Right Shoulder ABduction 180 Degrees  155 previous   Right Shoulder Internal Rotation 90 Degrees  same as previous   Right Shoulder External Rotation 71 Degrees  52 previous     PROM   Overall PROM Comments Assessed in supine, ER/IR adducted   PROM Assessment Site Shoulder   Right/Left Shoulder Right   Right Shoulder Flexion 170 Degrees  156 previous   Right Shoulder ABduction 180 Degrees  130 previous   Right Shoulder Internal Rotation 90  Degrees  same as previous   Right Shoulder External Rotation 68 Degrees  54 previous     Strength   Overall Strength Comments Assessed seated, ER/IR adducted   Strength Assessment Site Shoulder   Right/Left Shoulder Right   Right Shoulder Flexion 5/5  4/5 previous   Right Shoulder ABduction 5/5  4/5 previous   Right Shoulder Internal Rotation 5/5  4/5 previous   Right Shoulder External Rotation 5/5  4-/5 previous                  OT Treatments/Exercises (OP) - 04/18/16 1116      Exercises   Exercises Shoulder     Shoulder Exercises: Supine   Protraction PROM;5 reps   Horizontal ABduction PROM;5 reps   External Rotation PROM;5 reps   Internal Rotation PROM;5 reps   Flexion PROM;5 reps   ABduction PROM;5 reps     Shoulder Exercises: Standing   Protraction AROM;15 reps   Horizontal ABduction AROM;15 reps   External Rotation AROM;15 reps   Internal Rotation AROM;15 reps   Flexion AROM;15 reps   ABduction AROM;15 reps   Extension Theraband;15 reps   Theraband Level (Shoulder Extension) Level 3 (Green)   Row [employer] reps   Theraband Level (Shoulder Row) Level 3 (Green)   Retraction  Theraband;15 reps   Theraband Level (Shoulder Retraction) Level 3 (Green)     Shoulder Exercises: ROM/Strengthening   UBE (Upper Arm Bike) Level 1' 3' flexion 3' reverse   X to V Arms 15X   Other ROM/Strengthening Exercises green theraband strengthening exercises: horizontal ab/adduction, ER/IR, PNF patterns, 10X each     Manual Therapy   Manual Therapy Myofascial release   Manual therapy comments Manual therapy completed prior to therapeutic exercise   Myofascial Release Myofascial release to right upper arm, trapezius, and scapularis regions to decrease pain and fascial restrictions and increase joint range of motion               OT Education - 04/18/16 1138    Education provided Yes   Education Details green theraband strengthening    Person(s) Educated  Patient   Methods Explanation;Demonstration;Handout   Comprehension Verbalized understanding;Returned demonstration          OT Short Term Goals - 04/18/16 1133      OT SHORT TERM GOAL #1   Title Pt will be educated on HEP.    Time 4   Period Weeks   Status Achieved     OT SHORT TERM GOAL #2   Title Pt will return to prior level of functioning and independence in all daily tasks using RUE as dominant.    Time 4   Period Weeks   Status Achieved     OT SHORT TERM GOAL #3   Title Pt will decrease RUE pain to 3/10 or less with use during daily tasks.    Time 4   Period Weeks   Status Partially Met     OT SHORT TERM GOAL #4   Title Pt will decrease fascial restrictions in RUE to min amounts or less to increase mobility of RUE during functional tasks.    Time 4   Period Weeks   Status Achieved     OT SHORT TERM GOAL #5   Title Pt will increase A/ROM of RUE to WNL to improve ability to wash hair using RUE.    Time 4   Period Weeks   Status Achieved     OT SHORT TERM GOAL #6   Title Pt will increase RUE strength to 4+/5 to increase ability to use RUE as dominant when completing yardwork tasks.    Time 4   Period Weeks   Status Achieved                  Plan - 04/18/16 1158    Clinical Impression Statement A: Reassessment completed this session, pt has met 5/6 STGs and partially met an additional 1/6 STGs. Pt reports she is now able to use her RUE for all ADL and work tasks with no pain. Pt reports she now only has pain when she is sitting still or lying down, with the occasional shooting pain during the day that does not last long. Pt has made excellent progress with her ROM and strength and is agreeable to discharge today.    Plan P: Discharge pt.       Patient will benefit from skilled therapeutic intervention in order to improve the following deficits and impairments:     Visit Diagnosis: Other symptoms and signs involving the musculoskeletal  system  Pain in right shoulder    Problem List Patient Active Problem List   Diagnosis Date Noted  . Lateral epicondylitis of right elbow 07/27/2013     Guadelupe Rowe, OTR/L  [phone]  04/18/2016, 12:01 PM  [place] [address] [place], [place], [address] Phone: [phone]   Fax:  [phone]  Name: Stacey Rowe MRN: [id number] Date of Birth: [birth year]    OCCUPATIONAL THERAPY DISCHARGE SUMMARY  Visits from Start of Care: 7  Current functional level related to goals / functional outcomes: See above. Pt has made excellent progress with ROM and strength. Pt is now using RUE as dominant during ADL, work, and leisure tasks.    Remaining deficits: Pt continues to have intermittent shooting pain in the RUE. Pt describes as "sharp nerve pain." Pt reports this has decreased since beginning therapy, however she does still experience pain mostly at night and when sitting in her recliner, with occasional pains at other times during the day.    Education / Equipment: Pt provided with green theraband strengthening exercises, reviewed A/ROM HEP and scapular theraband HEP.  Plan: Patient agrees to discharge.  Patient goals were met. Patient is being discharged due to meeting the stated rehab goals.  ?????

## 2016-04-18 NOTE — Patient Instructions (Signed)
Strengthening: Chest Pull - Resisted   Hold Theraband in front of body with hands about shoulder width a part. Pull band a part and back together slowly. Repeat __10-15__ times. Complete __1__ set(s) per session.. Repeat _1-2___ session(s) per day.  http://orth.exer.us/926   Copyright  VHI. All rights reserved.   PNF Strengthening: Resisted   Standing with resistive band around each hand, bring right arm up and away, thumb back. Repeat _10-15___ times per set. Do _1___ sets per session. Do __1-2__ sessions per day.                           Resisted External Rotation: in Neutral - Bilateral   Sit or stand, tubing in both hands, elbows at sides, bent to 90, forearms forward. Pinch shoulder blades together and rotate forearms out. Keep elbows at sides. Repeat _10-15___ times per set. Do _1___ sets per session. Do _1-2___ sessions per day.  http://orth.exer.us/966   Copyright  VHI. All rights reserved.   PNF Strengthening: Resisted   Standing, hold resistive band above head. Bring right arm down and out from side. Repeat _10-15___ times per set. Do __1__ sets per session. Do _1-2___ sessions per day.  http://orth.exer.us/922   Copyright  VHI. All rights reserved.  

## 2016-04-21 DIAGNOSIS — M5030 Other cervical disc degeneration, unspecified cervical region: Secondary | ICD-10-CM | POA: Diagnosis not present

## 2016-04-21 DIAGNOSIS — M7501 Adhesive capsulitis of right shoulder: Secondary | ICD-10-CM | POA: Diagnosis not present

## 2016-04-21 DIAGNOSIS — M25511 Pain in right shoulder: Secondary | ICD-10-CM | POA: Diagnosis not present

## 2016-04-22 ENCOUNTER — Encounter (HOSPITAL_COMMUNITY): Payer: Self-pay

## 2016-04-24 ENCOUNTER — Encounter (HOSPITAL_COMMUNITY): Payer: Self-pay | Admitting: Occupational Therapy

## 2016-04-25 ENCOUNTER — Other Ambulatory Visit: Payer: Self-pay | Admitting: Sports Medicine

## 2016-04-25 DIAGNOSIS — M25511 Pain in right shoulder: Secondary | ICD-10-CM

## 2016-05-07 ENCOUNTER — Ambulatory Visit
Admission: RE | Admit: 2016-05-07 | Discharge: 2016-05-07 | Disposition: A | Discharge status: Home / Self Care | Payer: BLUE CROSS/BLUE SHIELD | Source: Ambulatory Visit | Attending: Sports Medicine | Admitting: Sports Medicine

## 2016-05-07 DIAGNOSIS — M25511 Pain in right shoulder: Secondary | ICD-10-CM

## 2016-05-07 MED ORDER — IOPAMIDOL (ISOVUE-M 200) INJECTION 41%
12.0000 mL | Freq: Once | INTRAMUSCULAR | Status: AC
Start: 2016-05-07 — End: 2016-05-07
  Administered 2016-05-07: 12 mL via INTRA_ARTICULAR

## 2016-05-19 DIAGNOSIS — M7501 Adhesive capsulitis of right shoulder: Secondary | ICD-10-CM | POA: Diagnosis not present

## 2016-05-19 DIAGNOSIS — M5030 Other cervical disc degeneration, unspecified cervical region: Secondary | ICD-10-CM | POA: Diagnosis not present

## 2016-05-19 DIAGNOSIS — M25511 Pain in right shoulder: Secondary | ICD-10-CM | POA: Diagnosis not present

## 2016-05-22 ENCOUNTER — Other Ambulatory Visit (HOSPITAL_COMMUNITY): Payer: Self-pay | Admitting: Sports Medicine

## 2016-05-22 DIAGNOSIS — M542 Cervicalgia: Secondary | ICD-10-CM

## 2016-06-02 ENCOUNTER — Encounter (HOSPITAL_COMMUNITY): Payer: Self-pay | Admitting: *Deleted

## 2016-06-03 ENCOUNTER — Ambulatory Visit (HOSPITAL_COMMUNITY)
Admission: RE | Admit: 2016-06-03 | Discharge: 2016-06-03 | Disposition: A | Discharge status: Home / Self Care | Payer: BLUE CROSS/BLUE SHIELD | Source: Ambulatory Visit | Attending: Sports Medicine | Admitting: Sports Medicine

## 2016-06-03 ENCOUNTER — Encounter (HOSPITAL_COMMUNITY)
Admission: RE | Disposition: A | Discharge status: Home / Self Care | Payer: Self-pay | Source: Ambulatory Visit | Attending: Sports Medicine

## 2016-06-03 ENCOUNTER — Encounter (HOSPITAL_COMMUNITY): Payer: Self-pay | Admitting: Anesthesiology

## 2016-06-03 ENCOUNTER — Ambulatory Visit (HOSPITAL_COMMUNITY): Payer: BLUE CROSS/BLUE SHIELD | Admitting: Anesthesiology

## 2016-06-03 DIAGNOSIS — M50322 Other cervical disc degeneration at C5-C6 level: Secondary | ICD-10-CM | POA: Insufficient documentation

## 2016-06-03 DIAGNOSIS — M4802 Spinal stenosis, cervical region: Secondary | ICD-10-CM | POA: Insufficient documentation

## 2016-06-03 DIAGNOSIS — M542 Cervicalgia: Secondary | ICD-10-CM

## 2016-06-03 HISTORY — PX: RADIOLOGY WITH ANESTHESIA: SHX6223

## 2016-06-03 HISTORY — DX: Other specified postprocedural states: Z98.890

## 2016-06-03 HISTORY — DX: Malignant (primary) neoplasm, unspecified: C80.1

## 2016-06-03 HISTORY — DX: Constipation, unspecified: K59.00

## 2016-06-03 HISTORY — DX: Nausea with vomiting, unspecified: R11.2

## 2016-06-03 HISTORY — DX: Unspecified osteoarthritis, unspecified site: M19.90

## 2016-06-03 HISTORY — DX: Claustrophobia: F40.240

## 2016-06-03 HISTORY — DX: Other complications of anesthesia, initial encounter: T88.59XA

## 2016-06-03 HISTORY — DX: Adverse effect of unspecified anesthetic, initial encounter: T41.45XA

## 2016-06-03 SURGERY — RADIOLOGY WITH ANESTHESIA
Anesthesia: General

## 2016-06-03 MED ORDER — OXYCODONE HCL 5 MG PO TABS: 5.0000 mg | ORAL_TABLET | Freq: Once | ORAL | Status: DC | PRN

## 2016-06-03 MED ORDER — ONDANSETRON HCL 4 MG/2ML IJ SOLN
4.0000 mg | Freq: Once | INTRAMUSCULAR | Status: AC | PRN
  Administered 2016-06-03: 4 mg via INTRAVENOUS

## 2016-06-03 MED ORDER — OXYCODONE HCL 5 MG/5ML PO SOLN: 5.0000 mg | Freq: Once | ORAL | Status: DC | PRN

## 2016-06-03 MED ORDER — LACTATED RINGERS IV SOLN
INTRAVENOUS | Status: DC
Start: 2016-06-03 — End: 2016-06-03
  Administered 2016-06-03: 07:00:00 via INTRAVENOUS

## 2016-06-03 MED ORDER — ONDANSETRON HCL 4 MG/2ML IJ SOLN
INTRAMUSCULAR | Status: AC
  Filled 2016-06-03: qty 2

## 2016-06-03 MED ORDER — FENTANYL CITRATE (PF) 100 MCG/2ML IJ SOLN: 25.0000 ug | INTRAMUSCULAR | Status: DC | PRN

## 2016-06-03 NOTE — Progress Notes (Signed)
Report given to maria rn as caregiver 

## 2016-06-03 NOTE — Anesthesia Preprocedure Evaluation (Addendum)
Anesthesia Evaluation  Patient identified by MRN, date of birth, ID band Patient awake    Reviewed: Allergy & Precautions, NPO status , Patient's Chart, lab work & pertinent test results  History of Anesthesia Complications (+) PONV and history of anesthetic complications  Airway Mallampati: II  TM Distance: >3 FB Neck ROM: Full    Dental  (+) Teeth Intact, Dental Advisory Given   Pulmonary    breath sounds clear to auscultation       Cardiovascular  Rhythm:Regular Rate:Normal     Neuro/Psych    GI/Hepatic   Endo/Other    Renal/GU      Musculoskeletal   Abdominal   Peds  Hematology   Anesthesia Other Findings   Reproductive/Obstetrics                            Anesthesia Physical Anesthesia Plan  ASA: II  Anesthesia Plan: General   Post-op Pain Management:    Induction: Intravenous  Airway Management Planned: LMA  Additional Equipment:   Intra-op Plan:   Post-operative Plan:   Informed Consent: I have reviewed the patients History and Physical, chart, labs and discussed the procedure including the risks, benefits and alternatives for the proposed anesthesia with the patient or authorized representative who has indicated his/her understanding and acceptance.   Dental advisory given and Dental Advisory Given  Plan Discussed with: CRNA and Anesthesiologist  Anesthesia Plan Comments:        Anesthesia Quick Evaluation

## 2016-06-04 ENCOUNTER — Encounter (HOSPITAL_COMMUNITY): Payer: Self-pay | Admitting: Radiology

## 2016-06-17 DIAGNOSIS — M4802 Spinal stenosis, cervical region: Secondary | ICD-10-CM | POA: Diagnosis not present

## 2016-06-18 DIAGNOSIS — Z23 Encounter for immunization: Secondary | ICD-10-CM | POA: Diagnosis not present

## 2016-07-09 DIAGNOSIS — Z01812 Encounter for preprocedural laboratory examination: Secondary | ICD-10-CM | POA: Diagnosis not present

## 2016-07-09 DIAGNOSIS — M4802 Spinal stenosis, cervical region: Secondary | ICD-10-CM | POA: Diagnosis not present

## 2016-07-17 DIAGNOSIS — M47892 Other spondylosis, cervical region: Secondary | ICD-10-CM | POA: Diagnosis not present

## 2016-07-17 DIAGNOSIS — M4802 Spinal stenosis, cervical region: Secondary | ICD-10-CM | POA: Diagnosis not present

## 2016-08-25 DIAGNOSIS — M4802 Spinal stenosis, cervical region: Secondary | ICD-10-CM | POA: Diagnosis not present

## 2016-11-28 DIAGNOSIS — M4802 Spinal stenosis, cervical region: Secondary | ICD-10-CM | POA: Diagnosis not present

## 2016-12-25 DIAGNOSIS — Z1389 Encounter for screening for other disorder: Secondary | ICD-10-CM | POA: Diagnosis not present

## 2016-12-25 DIAGNOSIS — K5900 Constipation, unspecified: Secondary | ICD-10-CM | POA: Diagnosis not present

## 2016-12-25 DIAGNOSIS — Z719 Counseling, unspecified: Secondary | ICD-10-CM | POA: Diagnosis not present

## 2016-12-25 DIAGNOSIS — Z008 Encounter for other general examination: Secondary | ICD-10-CM | POA: Diagnosis not present

## 2017-01-01 DIAGNOSIS — Z1231 Encounter for screening mammogram for malignant neoplasm of breast: Secondary | ICD-10-CM | POA: Diagnosis not present

## 2017-02-24 DIAGNOSIS — Z139 Encounter for screening, unspecified: Secondary | ICD-10-CM | POA: Diagnosis not present

## 2017-02-24 DIAGNOSIS — R7301 Impaired fasting glucose: Secondary | ICD-10-CM | POA: Diagnosis not present

## 2017-03-10 DIAGNOSIS — Z008 Encounter for other general examination: Secondary | ICD-10-CM | POA: Diagnosis not present

## 2017-03-10 DIAGNOSIS — K5900 Constipation, unspecified: Secondary | ICD-10-CM | POA: Diagnosis not present

## 2017-03-10 DIAGNOSIS — Z719 Counseling, unspecified: Secondary | ICD-10-CM | POA: Diagnosis not present

## 2017-04-16 IMAGING — MR MR SHOULDER*R* W/CM
6 series · 40 of 40 positions shown · IV contrast (agent unspecified)
Comparison: Injection images dated 05/07/2016

CLINICAL DATA: Right shoulder pain.

EXAM:
MR ARTHROGRAM OF THE RIGHT SHOULDER
TECHNIQUE: Multiplanar, multisequence MR imaging of the right shoulder was
performed following the administration of intra-articular contrast.
CONTRAST:  See Injection Documentation.

[Series 4: T1 fat-sat · axial · 4.0mm · 0.23mm/px · z∈[-2,+78]mm · 8 of 18 slices shown (1 of 4)]
[im 1/18]
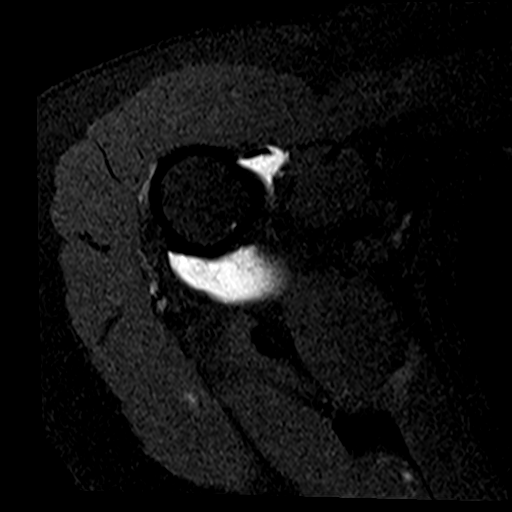
[im 3/18]
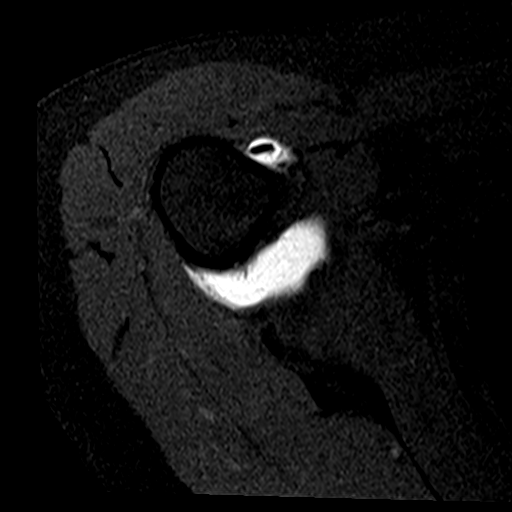
[im 5/18]
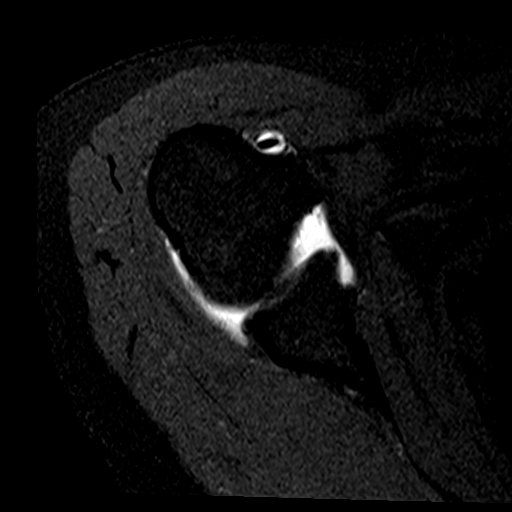
[im 8/18]
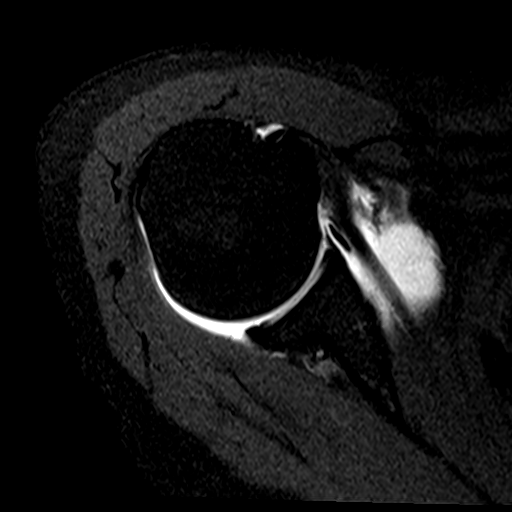
[im 10/18]
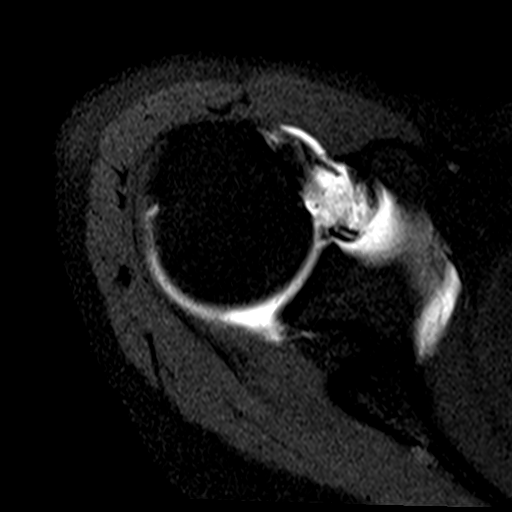
[im 13/18]
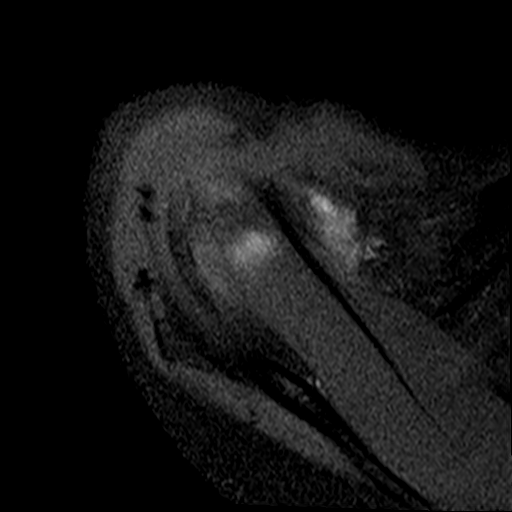
[im 15/18]
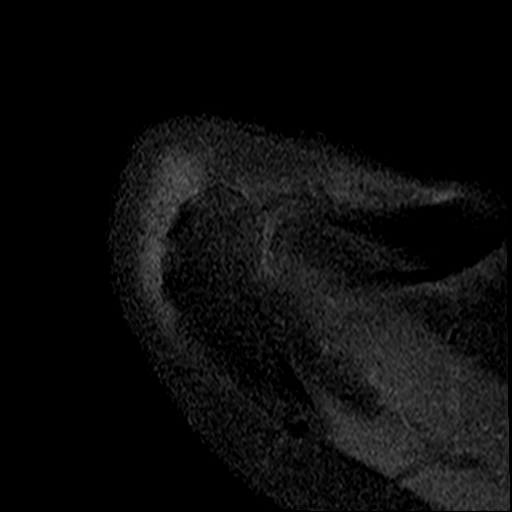
[im 18/18]
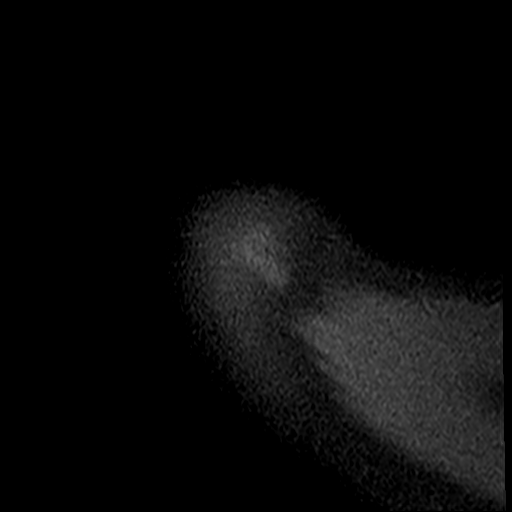

[Series 5: T2 fat-sat · oblique · 4.0mm · 0.55mm/px · 8 of 17 slices shown (1 of 2)]
[im 1/17]
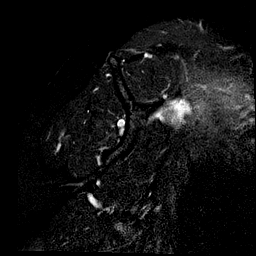
[im 3/17]
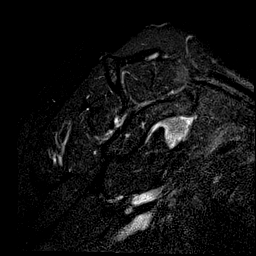
[im 5/17]
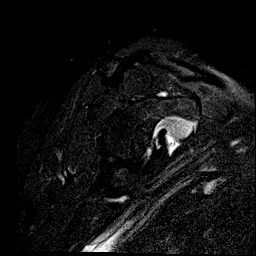
[im 7/17]
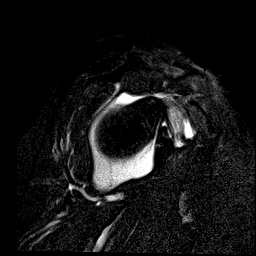
[im 10/17]
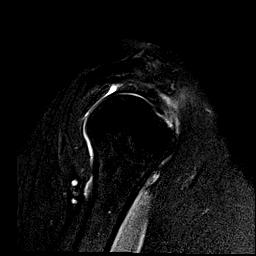
[im 12/17]
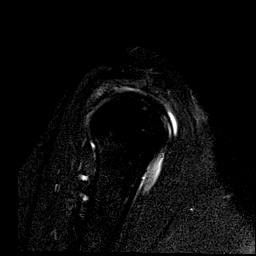
[im 14/17]
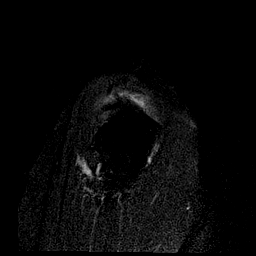
[im 17/17]
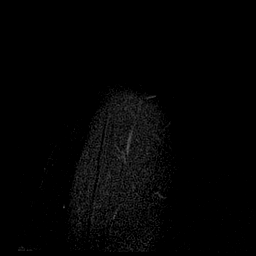

[Series 6: T1 fat-sat · oblique · 4.0mm · 0.55mm/px · 6 of 16 slices shown (2 of 4)]
[im 1/16]
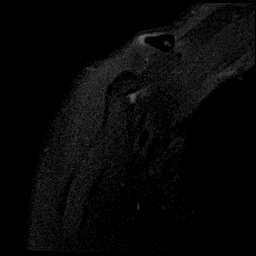
[im 4/16]
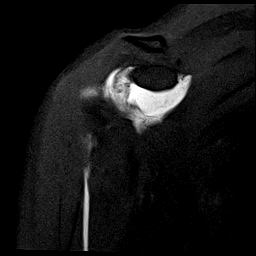
[im 7/16]
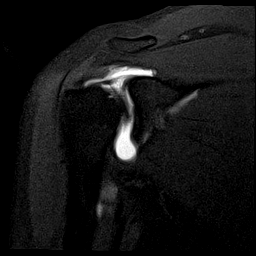
[im 10/16]
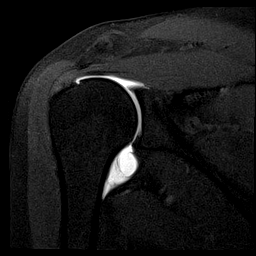
[im 13/16]
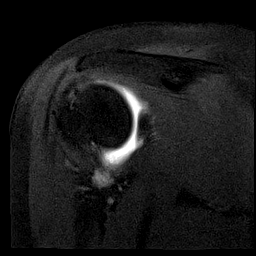
[im 16/16]
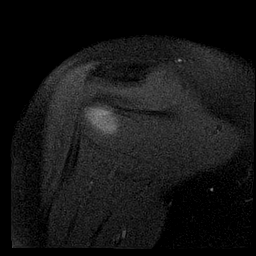

[Series 7: T1 fat-sat · oblique · 4.0mm · 0.44mm/px · 6 of 16 slices shown (3 of 4)]
[im 1/16]
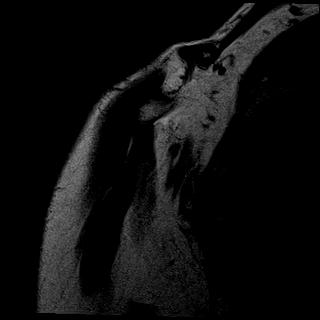
[im 4/16]
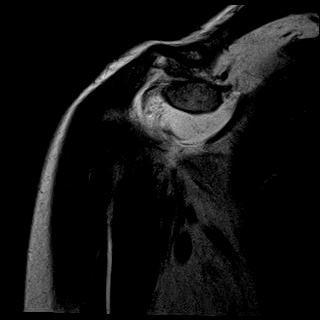
[im 7/16]
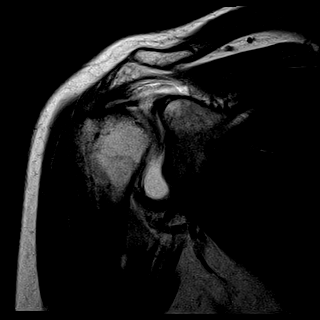
[im 10/16]
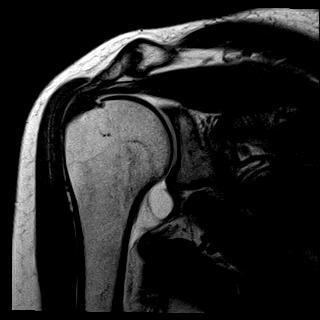
[im 13/16]
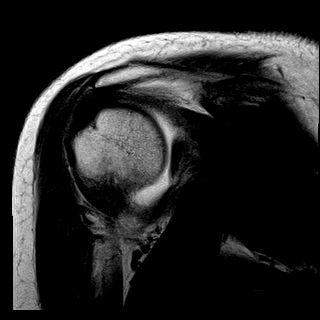
[im 16/16]
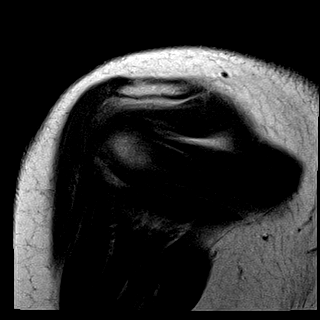

[Series 8: T2 fat-sat · oblique · 4.0mm · 0.55mm/px · 6 of 16 slices shown (2 of 2)]
[im 1/16]
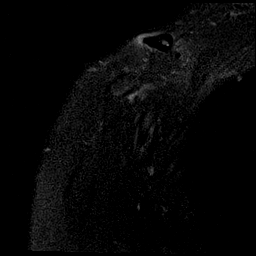
[im 4/16]
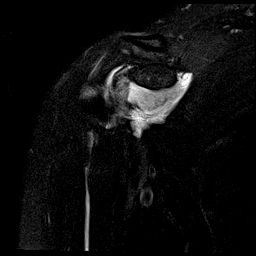
[im 7/16]
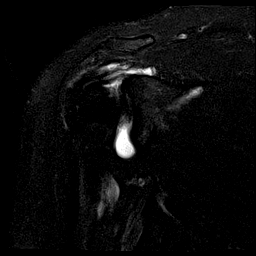
[im 10/16]
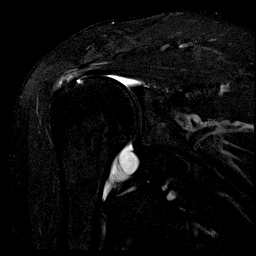
[im 13/16]
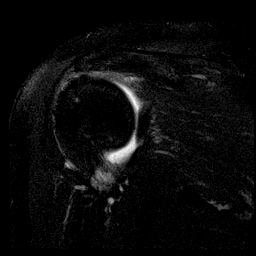
[im 16/16]
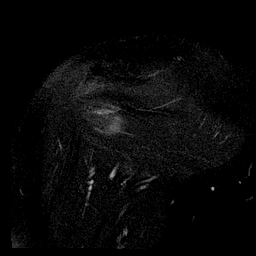

[Series 11: T1 fat-sat · sagittal · 4.0mm · 0.59mm/px · 6 of 16 slices shown (4 of 4)]
[im 1/16]
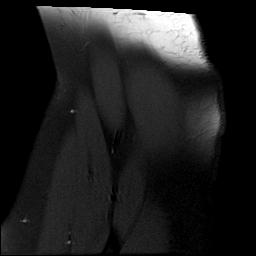
[im 4/16]
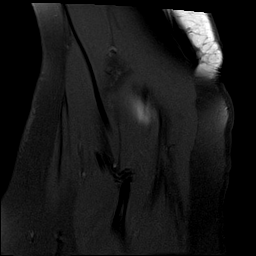
[im 7/16]
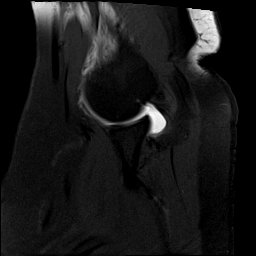
[im 10/16]
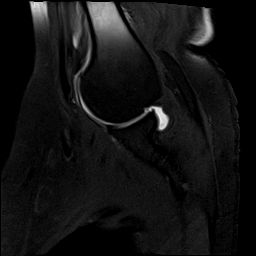
[im 13/16]
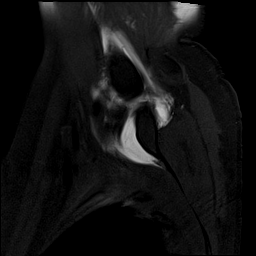
[im 16/16]
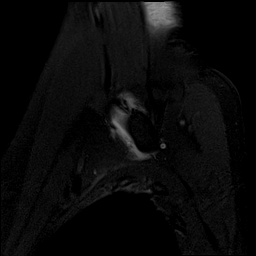

[40 of 40 positions shown; findings below may reference images not displayed]

FINDINGS: Small amount of iatrogenic contrast in the soft tissues of the
rotator cuff interval.

Rotator cuff: Intact.

Muscles: Normal.

Biceps long head: Properly located and intact.

Acromioclavicular Joint: Minimal degenerative changes. Type 2
acromion. No bursitis.

Glenohumeral Joint: Normal.

Labrum: Normal.

Bones: Normal.
IMPRESSION: Essentially normal right shoulder arthrogram.

## 2017-04-30 DIAGNOSIS — Z6825 Body mass index (BMI) 25.0-25.9, adult: Secondary | ICD-10-CM | POA: Diagnosis not present

## 2017-04-30 DIAGNOSIS — Z01419 Encounter for gynecological examination (general) (routine) without abnormal findings: Secondary | ICD-10-CM | POA: Diagnosis not present

## 2017-11-16 ENCOUNTER — Ambulatory Visit: Payer: BLUE CROSS/BLUE SHIELD | Admitting: Nurse Practitioner

## 2017-11-16 ENCOUNTER — Encounter: Payer: Self-pay | Admitting: Nurse Practitioner

## 2017-11-16 VITALS — BP 100/80 | Temp 99.2°F | Ht 66.75 in | Wt 163.0 lb

## 2017-11-16 DIAGNOSIS — J019 Acute sinusitis, unspecified: Secondary | ICD-10-CM

## 2017-11-16 DIAGNOSIS — B9689 Other specified bacterial agents as the cause of diseases classified elsewhere: Secondary | ICD-10-CM | POA: Diagnosis not present

## 2017-11-16 MED ORDER — AZITHROMYCIN 250 MG PO TABS: ORAL_TABLET | ORAL | 0 refills | Status: DC

## 2017-11-16 MED ORDER — HYDROCODONE-HOMATROPINE 5-1.5 MG/5ML PO SYRP: 5.0000 mL | ORAL_SOLUTION | ORAL | 0 refills | Status: DC | PRN

## 2017-11-16 NOTE — Progress Notes (Signed)
   Subjective:    Patient ID: Stacey Rowe, female    DOB: May 01, 1961, 57 y.o.   MRN: [id number]  HPI Patient is here today with complaints of a cough,congestion,fever, runny nose,sore throat,headache since last Wednesday. She has been taking mucinex cold and flu day and night rest. Review of Systems Presents for complaints of illness that began on 3/6.  Low-grade fever.  Sore throat has resolved.  Facial/frontal area headache.  Runny nose.  Frequent cough especially at nighttime.  Producing dark yellow mucus.  Slightly better with Mucinex.  Unable to rest last night due to cough.  No wheezing.  Gets regular preventive health physicals with her gynecologist.    Objective:   Physical Exam NAD.  Alert, oriented.  TMs retracted bilateral, no erythema.  Pharynx posterior erythematous with yellowish PND noted.  Neck supple with mild soft anterior adenopathy.  Lungs clear.  Heart regular rate and rhythm.       Assessment & Plan:  Acute bacterial rhinosinusitis  Meds ordered this encounter  Medications  . azithromycin (ZITHROMAX Z-PAK) 250 MG tablet    Sig: Take 2 tablets (500 mg) on  Day 1,  followed by 1 tablet (250 mg) once daily on Days 2 through 5.    Dispense:  6 each    Refill:  0    Order Specific Question:   Supervising Provider    Answer:   Mikey Kirschner [[id number]]  . HYDROcodone-homatropine (HYCODAN) 5-1.5 MG/5ML syrup    Sig: Take 5 mLs by mouth every 4 (four) hours as needed.    Dispense:  90 mL    Refill:  0    Order Specific Question:   Supervising Provider    Answer:   Mikey Kirschner [[id number]]   Hydrocodone for nighttime use.  Drowsiness precautions.  Continue OTC meds as directed.  Warning signs reviewed.  Call back by the end of the week if no improvement, sooner if worse.

## 2017-11-23 ENCOUNTER — Ambulatory Visit: Payer: BLUE CROSS/BLUE SHIELD | Admitting: Family Medicine

## 2017-11-23 ENCOUNTER — Encounter: Payer: Self-pay | Admitting: Family Medicine

## 2017-11-23 VITALS — BP 118/86 | Temp 97.6°F | Ht 66.75 in | Wt 162.0 lb

## 2017-11-23 DIAGNOSIS — B9689 Other specified bacterial agents as the cause of diseases classified elsewhere: Secondary | ICD-10-CM

## 2017-11-23 DIAGNOSIS — J019 Acute sinusitis, unspecified: Secondary | ICD-10-CM | POA: Diagnosis not present

## 2017-11-23 DIAGNOSIS — J4521 Mild intermittent asthma with (acute) exacerbation: Secondary | ICD-10-CM | POA: Diagnosis not present

## 2017-11-23 MED ORDER — AMOXICILLIN 500 MG PO CAPS: 500.0000 mg | ORAL_CAPSULE | Freq: Three times a day (TID) | ORAL | 0 refills | Status: AC

## 2017-11-23 MED ORDER — PREDNISONE 20 MG PO TABS: ORAL_TABLET | ORAL | 0 refills | Status: DC

## 2017-11-23 MED ORDER — ALBUTEROL SULFATE HFA 108 (90 BASE) MCG/ACT IN AERS: INHALATION_SPRAY | RESPIRATORY_TRACT | 0 refills | Status: DC

## 2017-11-23 MED ORDER — AMOXICILLIN 500 MG PO CAPS: 500.0000 mg | ORAL_CAPSULE | Freq: Three times a day (TID) | ORAL | 0 refills | Status: DC

## 2017-11-23 NOTE — Progress Notes (Signed)
   Subjective:    Patient ID: Stacey Rowe, female    DOB: [birth year], 59 y.o.   MRN: [id number]  HPI Patient is here today with complaints of fever,cough,congestion,headache,sinus drainage,runny nose. She was seen last week by Hoyle Sauer and feels she is not much better. She states she was given a zpak and finished it on March 15. She also was given hycodan and thinks she is allergic as she states she was itching and does not recall a rash,but did vomit it up as well.  Temp did come back ghis weekend  Temp now down a bit   No hx of wheezing in past but has now developed it, worse zt night   Non smoker  n     Review of Systems No headache, no major weight loss or weight gain, no chest pain no back pain abdominal pain no change in bowel habits complete ROS otherwise negative     Objective:   Physical Exam   impression alert active good hydration positive nasal congestion.  Positive frontal tendernessLungs expiratory wheezes throughout all lung fields no inspiratory crackles no tachypnea      Assessment & Plan:  1 impression rhinosinusitis/bronchitis with exacerbation of reactive airways.  Albuterol use discussed.  And demonstrated.  Prednisone taper.  Antibiotics prescribed symptom care discussed

## 2017-12-15 ENCOUNTER — Other Ambulatory Visit: Payer: Self-pay | Admitting: Family Medicine

## 2018-02-04 DIAGNOSIS — Z1231 Encounter for screening mammogram for malignant neoplasm of breast: Secondary | ICD-10-CM | POA: Diagnosis not present

## 2018-04-13 DIAGNOSIS — R7301 Impaired fasting glucose: Secondary | ICD-10-CM | POA: Diagnosis not present

## 2018-04-13 DIAGNOSIS — Z139 Encounter for screening, unspecified: Secondary | ICD-10-CM | POA: Diagnosis not present

## 2018-04-27 DIAGNOSIS — Z719 Counseling, unspecified: Secondary | ICD-10-CM | POA: Diagnosis not present

## 2018-04-27 DIAGNOSIS — Z1389 Encounter for screening for other disorder: Secondary | ICD-10-CM | POA: Diagnosis not present

## 2018-04-27 DIAGNOSIS — E785 Hyperlipidemia, unspecified: Secondary | ICD-10-CM | POA: Diagnosis not present

## 2018-04-27 DIAGNOSIS — K5900 Constipation, unspecified: Secondary | ICD-10-CM | POA: Diagnosis not present

## 2018-04-27 DIAGNOSIS — Z008 Encounter for other general examination: Secondary | ICD-10-CM | POA: Diagnosis not present

## 2018-05-06 DIAGNOSIS — Z8042 Family history of malignant neoplasm of prostate: Secondary | ICD-10-CM | POA: Diagnosis not present

## 2018-05-06 DIAGNOSIS — Z113 Encounter for screening for infections with a predominantly sexual mode of transmission: Secondary | ICD-10-CM | POA: Diagnosis not present

## 2018-05-06 DIAGNOSIS — Z803 Family history of malignant neoplasm of breast: Secondary | ICD-10-CM | POA: Diagnosis not present

## 2018-05-06 DIAGNOSIS — Z6826 Body mass index (BMI) 26.0-26.9, adult: Secondary | ICD-10-CM | POA: Diagnosis not present

## 2018-05-06 DIAGNOSIS — Z01419 Encounter for gynecological examination (general) (routine) without abnormal findings: Secondary | ICD-10-CM | POA: Diagnosis not present

## 2018-05-06 DIAGNOSIS — N760 Acute vaginitis: Secondary | ICD-10-CM | POA: Diagnosis not present

## 2018-06-10 DIAGNOSIS — Z23 Encounter for immunization: Secondary | ICD-10-CM | POA: Diagnosis not present

## 2018-06-17 DIAGNOSIS — Z809 Family history of malignant neoplasm, unspecified: Secondary | ICD-10-CM | POA: Diagnosis not present

## 2018-06-17 DIAGNOSIS — Z13820 Encounter for screening for osteoporosis: Secondary | ICD-10-CM | POA: Diagnosis not present

## 2018-10-28 DIAGNOSIS — Z008 Encounter for other general examination: Secondary | ICD-10-CM | POA: Diagnosis not present

## 2018-10-28 DIAGNOSIS — Z1389 Encounter for screening for other disorder: Secondary | ICD-10-CM | POA: Diagnosis not present

## 2018-10-28 DIAGNOSIS — Z719 Counseling, unspecified: Secondary | ICD-10-CM | POA: Diagnosis not present

## 2018-10-28 DIAGNOSIS — Z7189 Other specified counseling: Secondary | ICD-10-CM | POA: Diagnosis not present

## 2019-04-19 DIAGNOSIS — E785 Hyperlipidemia, unspecified: Secondary | ICD-10-CM | POA: Diagnosis not present

## 2019-04-19 DIAGNOSIS — Z0189 Encounter for other specified special examinations: Secondary | ICD-10-CM | POA: Diagnosis not present

## 2019-04-19 DIAGNOSIS — Z139 Encounter for screening, unspecified: Secondary | ICD-10-CM | POA: Diagnosis not present

## 2019-04-19 DIAGNOSIS — Z0130 Encounter for examination of blood pressure without abnormal findings: Secondary | ICD-10-CM | POA: Diagnosis not present

## 2019-05-03 DIAGNOSIS — E785 Hyperlipidemia, unspecified: Secondary | ICD-10-CM | POA: Diagnosis not present

## 2019-05-24 DIAGNOSIS — Z6827 Body mass index (BMI) 27.0-27.9, adult: Secondary | ICD-10-CM | POA: Diagnosis not present

## 2019-05-24 DIAGNOSIS — Z01419 Encounter for gynecological examination (general) (routine) without abnormal findings: Secondary | ICD-10-CM | POA: Diagnosis not present

## 2019-06-07 DIAGNOSIS — Z0130 Encounter for examination of blood pressure without abnormal findings: Secondary | ICD-10-CM | POA: Diagnosis not present

## 2019-06-07 DIAGNOSIS — Z139 Encounter for screening, unspecified: Secondary | ICD-10-CM | POA: Diagnosis not present

## 2019-06-09 DIAGNOSIS — F331 Major depressive disorder, recurrent, moderate: Secondary | ICD-10-CM | POA: Diagnosis not present

## 2019-06-09 DIAGNOSIS — F411 Generalized anxiety disorder: Secondary | ICD-10-CM | POA: Diagnosis not present

## 2019-06-21 DIAGNOSIS — F331 Major depressive disorder, recurrent, moderate: Secondary | ICD-10-CM | POA: Diagnosis not present

## 2019-06-21 DIAGNOSIS — F411 Generalized anxiety disorder: Secondary | ICD-10-CM | POA: Diagnosis not present

## 2019-06-21 DIAGNOSIS — Z23 Encounter for immunization: Secondary | ICD-10-CM | POA: Diagnosis not present

## 2019-07-12 DIAGNOSIS — F331 Major depressive disorder, recurrent, moderate: Secondary | ICD-10-CM | POA: Diagnosis not present

## 2019-07-12 DIAGNOSIS — F411 Generalized anxiety disorder: Secondary | ICD-10-CM | POA: Diagnosis not present

## 2019-08-02 DIAGNOSIS — F411 Generalized anxiety disorder: Secondary | ICD-10-CM | POA: Diagnosis not present

## 2019-08-02 DIAGNOSIS — F331 Major depressive disorder, recurrent, moderate: Secondary | ICD-10-CM | POA: Diagnosis not present

## 2019-08-19 ENCOUNTER — Other Ambulatory Visit: Payer: Self-pay

## 2019-08-19 ENCOUNTER — Ambulatory Visit: Payer: BLUE CROSS/BLUE SHIELD | Admitting: Psychiatry

## 2019-08-19 ENCOUNTER — Ambulatory Visit (INDEPENDENT_AMBULATORY_CARE_PROVIDER_SITE_OTHER): Payer: BC Managed Care – PPO | Admitting: Psychiatry

## 2019-08-19 ENCOUNTER — Encounter: Payer: Self-pay | Admitting: Psychiatry

## 2019-08-19 DIAGNOSIS — F411 Generalized anxiety disorder: Secondary | ICD-10-CM | POA: Diagnosis not present

## 2019-08-19 NOTE — Progress Notes (Signed)
[facility] Counsel[facility] Initial Adult Exam  Name: Stacey Rowe Date: 08/19/2019 MRN: [id number] DOB: Mar 25, 1961 PCP: Kathyrn Drown, MD  Time spent: 60 minutes  10:00am to 11:00am   Guardian/Payee:  patent  Paperwork requested:  No   Reason for Visit /Presenting Problem/ Symptoms : anxious, some anger, "I saw NP at work and was put on meds Escitalopram and I think that has helped.  Used to be angry all the time. "some anxiety with panic", claustrophobic  Mental Status Exam:   Appearance:   Casual     Behavior:  Appropriate and Sharing  Motor:  Normal  Speech/Language:   Normal Rate  Affect:  anxious  Mood:  anxious  Thought process:  normal  Thought content:    WNL  Sensory/Perceptual disturbances:    WNL  Orientation:  oriented to person, place, time/date, situation, day of week, month of year and year  Attention:  Good  Concentration:  Fair  Memory:  [place] of knowledge:   Good  Insight:    Good  Judgment:   Good  Impulse Control:  Fair   Reported Symptoms:  See symptoms above  Risk Assessment: Danger to Self:  No Self-injurious Behavior: No Danger to Others: No Duty to Warn:no Physical Aggression / Violence:No  Access to Firearms a concern: No  Gang Involvement:No  Patient / guardian was educated about steps to take if suicide or homicide risk level increases between visits:   PATIENT DENIES ANY SI OR HI. While future psychiatric events cannot be accurately predicted, the patient does not currently require acute inpatient psychiatric care and does not currently meet [facility] involuntary commitment criteria.  Substance Abuse History: Current substance abuse: No     Past Psychiatric History:   Previous psychological history is significant for family issue in 1990"s Outpatient Providers  Not known History of Psych Hospitalization: No  Psychological Testing: n/a   Abuse History: Victim of No., none   Report needed: No. Victim of  Neglect:No. Perpetrator of n/a  Witness / Exposure to Domestic Violence: No   Protective Services Involvement: No  Witness to [employer] Violence:  No   Family History: No family history on file.  Living situation: the patient lives alone  Sexual Orientation:  Straight  Relationship Status: divorced twice  Name of spouse / other:- n/a             If a parent, number of children / ages:  No kids  Support Systems; friends  Financial Stress:  No   Income/Employment/Disability: Employment  Armed forces logistics/support/administrative officer: No   Educational History: Education: Museum/gallery exhibitions officer:   Pro[employer]ant  Any cultural differences that may affect / interfere with treatment:  not applicable   Recreation/Hobbies: likes to play games on phone  Stressors:Occupational concerns Other: my boyfriend and me fighting  Strengths:  Supportive Relationships, Friends, Social worker, Spirituality, Hopefulness, Conservator, museum/gallery, Able to Communicate Effectively   Barriers:  myself but wants to get better  Legal History: Pending legal issue / charges: no. History of legal issue / charges: no  Medical History/Surgical History:Reviewed with patient and she confirms info below is correct. Past Medical History:  Diagnosis Date  . Arthritis   . 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 Claustrophobia   . Complication of anesthesia   . Constipation   . PONV (postoperative nausea and vomiting)     Past Surgical History:  Procedure Laterality Date  . BACK SURGERY  2005  Lumbar Discectomy  . COLONOSCOPY    . NASAL SEPTUM SURGERY    . OVARIAN CYST SURGERY  1980's  . RADIOLOGY WITH ANESTHESIA N/A 06/03/2016   Procedure: MRI CERVICAL SPINE WITHOUT CONTRAST;  Surgeon: Medication Radiologist, MD;  Location: [facility];  Service: Radiology;  Laterality: N/A;    Medications: Reviewed with patient and she confirms info below except not taking  Omega-3 Fatty Acids Current Outpatient Medications   Medication Sig Dispense Refill  . albuterol (PROAIR HFA) 108 (90 Base) MCG/ACT inhaler INHALE 2 PUFFS INTO THE LUNGS FOUR TIMES DAILY AS NEEDED FOR WHEEZING 8.5 Inhaler 0  . BIOTIN PO Take 1 tablet by mouth daily.    . cholecalciferol (VITAMIN D) 1000 units tablet Take 1,000 Units by mouth daily.    . meloxicam (MOBIC) 15 MG tablet Take 15 mg by mouth 2 (two) times daily as needed for pain.  3  . naproxen sodium (ANAPROX) 220 MG tablet Take 440 mg by mouth daily as needed (pain).    . Omega-3 Fatty Acids (FISH OIL PO) Take 1 capsule by mouth daily.    . polyethylene glycol (MIRALAX / GLYCOLAX) packet Take 17 g by mouth daily as needed for mild constipation (takes once daily every day and 2nd time only if needed).    . predniSONE (DELTASONE) 20 MG tablet Three po for three days,then two po for three days,then one po for three days 18 tablet 0   No current facility-administered medications for this visit.    Allergies  Allergen Reactions  . Hydrocodone Itching    Diagnoses:    ICD-10-CM   1. Generalized anxiety disorder  F41.1     Plan of Care:  Patient not signing tx plan on computer screen due to [place].  Treatment goals: Goals remains on tx plan while patient works on strategies to meet her goals. Progress is noted each session and documented in "Progress" section of Plan.  Long term goal: Reduce overall level, frequency, and intensity of the anxiety so that daily functioning is not impaired.  Short term goal: Increase understanding of beliefs and messages that produce worry and anxiety.  Strategy:  Develop behavioral and cognitive strategies to reduce or eliminate the irrational anxiety.  Progress: This is patient's first session and we worked together formulating her tx goal plan.  She is to go ahead and start monitoring her thoughts and feelings, especially any related to anxiety.  Patient to practice trying to interrupt those thoughts. To make notes as discussed today and  bring to next session.     Next appt within 2 weeks.   Shanon Ace, LCSW

## 2019-08-25 DIAGNOSIS — F411 Generalized anxiety disorder: Secondary | ICD-10-CM | POA: Diagnosis not present

## 2019-08-25 DIAGNOSIS — F331 Major depressive disorder, recurrent, moderate: Secondary | ICD-10-CM | POA: Diagnosis not present

## 2019-09-20 ENCOUNTER — Other Ambulatory Visit: Payer: Self-pay

## 2019-09-20 ENCOUNTER — Ambulatory Visit (INDEPENDENT_AMBULATORY_CARE_PROVIDER_SITE_OTHER): Payer: BC Managed Care – PPO | Admitting: Psychiatry

## 2019-09-20 DIAGNOSIS — F411 Generalized anxiety disorder: Secondary | ICD-10-CM

## 2019-09-20 NOTE — Progress Notes (Signed)
      [facility] Counselor/Therapist Progress Note  Patient ID: Stacey Rowe, MRN: [id number],    Date: 09/20/2019  Time Spent: 60 minutes  11:00am to 12:00noon  Treatment Type: Individual Therapy  Reported Symptoms: anxiety, frustration  Mental Status Exam:  Appearance:   Casual     Behavior:  Appropriate and Sharing  Motor:  Normal  Speech/Language:   Normal Rate  Affect:  anxious  Mood:  anxious  Thought process:  normal  Thought content:    WNL  Sensory/Perceptual disturbances:    WNL  Orientation:  oriented to person, place, time/date, situation, day of week, month of year and year  Attention:  Good  Concentration:  Good  Memory:  reports some forgetfulness but felt that was some med-related and is speaking with med prescriber about this.  Fund of knowledge:   Good  Insight:    Good  Judgment:   Good  Impulse Control:  Good   Risk Assessment: Danger to Self:  No Self-injurious Behavior: No Danger to Others: No Duty to Warn:no Physical Aggression / Violence:No  Access to Firearms a concern: No  Gang Involvement:No   Subjective: Patient in today with anxiety and frustrations and symptoms have lessened since last appt.  Remains on her Lexapro and senses some improvement.   Interventions: Cognitive Behavioral Therapy and Solution-Oriented/Positive Psychology  Diagnosis:   ICD-10-CM   1. Generalized anxiety disorder  F41.1     Plan of Care:  Patient not signing tx plan on computer screen due to [place].  Treatment goals: Goals remains on tx plan while patient works on strategies to meet her goals. Progress is noted each session and documented in "Progress" section of Plan.  Long term goal: Reduce overall level, frequency, and intensity of the anxiety so that daily functioning is not impaired.  Short term goal: Increase understanding of beliefs and messages that produce worry and anxiety.  Strategy:  Develop behavioral and cognitive strategies to  reduce or eliminate the irrational anxiety.  Progress: Follow up with patient on her homework from first session as she is making progress on her goals.  Did monitor her thoughts especially when more anxious and seeing that her anxious feelings were actually preceded by anxious thoughts, but not feeling she could go further and successfully change her anxious/negative thoughts. So we worked on this in session today, practicing this strategy with actual examples from patient and she was able to be more successful in changing a negative/anxious thought into a more realistic, positive thought.  Also worked on additional specific anxious thoughts that are heightening her anxiety, including thoughts about Covid and job assignment overload which are stressing patient a lot currently.  Challenged a couple of those thoughts and she was able to see how they were not reality-based and were creating anxiety and fear for patient.  Shared that she is being more asserive and self-caring at work by setting appropriate limits with other people there "rather than always being the person who picks up the slack for everybody."  Patient expresses that she "feels happier about some of the changes she is making." Goal review and progress noted with patient.   Next appt within 2-3 weeks.   Shanon Ace, LCSW

## 2019-10-04 ENCOUNTER — Ambulatory Visit (INDEPENDENT_AMBULATORY_CARE_PROVIDER_SITE_OTHER): Payer: BC Managed Care – PPO | Admitting: Psychiatry

## 2019-10-04 ENCOUNTER — Other Ambulatory Visit: Payer: Self-pay

## 2019-10-04 DIAGNOSIS — F411 Generalized anxiety disorder: Secondary | ICD-10-CM

## 2019-10-04 NOTE — Progress Notes (Signed)
      [facility] Counselor/Therapist Progress Note  Patient ID: Stacey Rowe, MRN: [id number],    Date: 10/04/2019  Time Spent: 60 minutes  11:00am to 12:00noon  Treatment Type: Individual Therapy  Reported Symptoms: anxiety (improving)  Mental Status Exam:  Appearance:   Well Groomed     Behavior:  Appropriate, Sharing and Motivated  Motor:  Normal  Speech/Language:   Normal Rate  Affect:  anxious  Mood:  anxious  Thought process:  normal  Thought content:    WNL  Sensory/Perceptual disturbances:    WNL  Orientation:  oriented to person, place, time/date, situation, day of week, month of year and year  Attention:  Good  Concentration:  Good  Memory:  WNL  Fund of knowledge:   Good  Insight:    Good  Judgment:   Good  Impulse Control:  Good   Risk Assessment: Danger to Self:  No Self-injurious Behavior: No Danger to Others: No Duty to Warn:no Physical Aggression / Violence:No  Access to Firearms a concern: No  Gang Involvement:No   Subjective:  Patient in today reporting some improvement in her anxiety.  Adopted 2 kittens this past week and is enjoying them.    Interventions: Cognitive Behavioral Therapy and Solution-Oriented/Positive Psychology  Diagnosis:   ICD-10-CM   1. Generalized anxiety disorder  F41.1      Plan of Care: Patient not signing tx plan on computer screen due to [place].  Treatment goals: Goals remains on tx plan while patient works on strategies to meet her goals. Progress is noted each session and documented in "Progress" section of Plan.  Long term goal: Reduce overall level, frequency, and intensity of the anxiety so that daily functioning is not impaired.  Short term goal: Increase understanding of beliefs and messages that produce worry and anxiety.  Strategy: Develop behavioral and cognitive strategies to reduce or eliminate the irrational anxiety.  Progress: Patient in today and has continued her work on managing  her anxiety. Work has been stressful, partly due to [employer] and other ongoing problems "that I've been working on, including having a back-up."  Patient successfully working on reducing the frenquency and intensity of her anxiety and is definitely realizing her progress.  Is finally noticing some feeling better.  "I know I've turned a corner" in managing my anxiety but I know I need to keep "practicing the gains I've made".  Continues to stay away from "world news" as that used to feed her anxiety. She reports feeling better about herself. Wanting to further her anxiety management skills, and to be more consistent and very intentional, as she wants her positive changes to be lifelong habits. Goal review and progress noted with patient.  Stressed maintaining her gains. Is having some financial stress and her HSA card is to be renewed and will cal back for follow-up appt. Shanon Ace, LCSW

## 2019-10-18 ENCOUNTER — Ambulatory Visit: Payer: BC Managed Care – PPO | Admitting: Psychiatry

## 2019-11-01 ENCOUNTER — Ambulatory Visit: Payer: BC Managed Care – PPO | Admitting: Psychiatry

## 2019-11-22 DIAGNOSIS — I788 Other diseases of capillaries: Secondary | ICD-10-CM | POA: Diagnosis not present

## 2019-11-22 DIAGNOSIS — L570 Actinic keratosis: Secondary | ICD-10-CM | POA: Diagnosis not present

## 2019-11-22 DIAGNOSIS — D2339 Other benign neoplasm of skin of other parts of face: Secondary | ICD-10-CM | POA: Diagnosis not present

## 2019-11-22 DIAGNOSIS — X32XXXA Exposure to sunlight, initial encounter: Secondary | ICD-10-CM | POA: Diagnosis not present

## 2020-03-10 DIAGNOSIS — T782XXA Anaphylactic shock, unspecified, initial encounter: Secondary | ICD-10-CM | POA: Diagnosis not present

## 2020-03-10 DIAGNOSIS — T63441A Toxic effect of venom of bees, accidental (unintentional), initial encounter: Secondary | ICD-10-CM | POA: Diagnosis not present

## 2020-03-10 DIAGNOSIS — R21 Rash and other nonspecific skin eruption: Secondary | ICD-10-CM | POA: Diagnosis not present

## 2020-03-10 DIAGNOSIS — R220 Localized swelling, mass and lump, head: Secondary | ICD-10-CM | POA: Diagnosis not present

## 2020-03-28 DIAGNOSIS — Z20822 Contact with and (suspected) exposure to covid-19: Secondary | ICD-10-CM | POA: Diagnosis not present

## 2020-04-27 DIAGNOSIS — Z20828 Contact with and (suspected) exposure to other viral communicable diseases: Secondary | ICD-10-CM | POA: Diagnosis not present

## 2020-05-01 ENCOUNTER — Telehealth: Payer: Self-pay | Admitting: Family Medicine

## 2020-05-01 NOTE — Telephone Encounter (Signed)
Pt was exposed to Covid was tested for Covid no results back yet and patient is running a fever wants to know if something could be called in or directed what to take.  Pt pharmacy [facility] [address]   Pt call back [phone]

## 2020-05-01 NOTE — Telephone Encounter (Signed)
Left message to return call 

## 2020-05-04 NOTE — Telephone Encounter (Signed)
Left message to return call 

## 2020-05-07 ENCOUNTER — Encounter (HOSPITAL_COMMUNITY): Payer: Self-pay

## 2020-05-07 ENCOUNTER — Emergency Department (HOSPITAL_COMMUNITY): Payer: BC Managed Care – PPO

## 2020-05-07 ENCOUNTER — Other Ambulatory Visit: Payer: Self-pay

## 2020-05-07 ENCOUNTER — Inpatient Hospital Stay (HOSPITAL_COMMUNITY)
Admission: EM | Admit: 2020-05-07 | Discharge: 2020-05-09 | DRG: 177 | Disposition: A | Discharge status: Home / Self Care | Payer: BC Managed Care – PPO | Attending: Family Medicine | Admitting: Family Medicine

## 2020-05-07 DIAGNOSIS — J988 Other specified respiratory disorders: Secondary | ICD-10-CM | POA: Diagnosis present

## 2020-05-07 DIAGNOSIS — Z79899 Other long term (current) drug therapy: Secondary | ICD-10-CM | POA: Diagnosis not present

## 2020-05-07 DIAGNOSIS — K5900 Constipation, unspecified: Secondary | ICD-10-CM | POA: Diagnosis not present

## 2020-05-07 DIAGNOSIS — J9600 Acute respiratory failure, unspecified whether with hypoxia or hypercapnia: Secondary | ICD-10-CM | POA: Diagnosis not present

## 2020-05-07 DIAGNOSIS — J9601 Acute respiratory failure with hypoxia: Secondary | ICD-10-CM | POA: Diagnosis not present

## 2020-05-07 DIAGNOSIS — F40240 Claustrophobia: Secondary | ICD-10-CM | POA: Diagnosis present

## 2020-05-07 DIAGNOSIS — Z791 Long term (current) use of non-steroidal anti-inflammatories (NSAID): Secondary | ICD-10-CM | POA: Diagnosis not present

## 2020-05-07 DIAGNOSIS — R7401 Elevation of levels of liver transaminase levels: Secondary | ICD-10-CM | POA: Diagnosis not present

## 2020-05-07 DIAGNOSIS — M1990 Unspecified osteoarthritis, unspecified site: Secondary | ICD-10-CM | POA: Diagnosis not present

## 2020-05-07 DIAGNOSIS — Z85828 Personal history of other malignant neoplasm of skin: Secondary | ICD-10-CM | POA: Diagnosis not present

## 2020-05-07 DIAGNOSIS — R197 Diarrhea, unspecified: Secondary | ICD-10-CM | POA: Diagnosis present

## 2020-05-07 DIAGNOSIS — Z885 Allergy status to narcotic agent status: Secondary | ICD-10-CM | POA: Diagnosis not present

## 2020-05-07 DIAGNOSIS — J1282 Pneumonia due to coronavirus disease 2019: Secondary | ICD-10-CM | POA: Diagnosis present

## 2020-05-07 DIAGNOSIS — U071 COVID-19: Secondary | ICD-10-CM | POA: Diagnosis not present

## 2020-05-07 DIAGNOSIS — E876 Hypokalemia: Secondary | ICD-10-CM | POA: Diagnosis not present

## 2020-05-07 DIAGNOSIS — R0602 Shortness of breath: Secondary | ICD-10-CM | POA: Diagnosis not present

## 2020-05-07 DIAGNOSIS — Z7952 Long term (current) use of systemic steroids: Secondary | ICD-10-CM | POA: Diagnosis not present

## 2020-05-07 LAB — PROCALCITONIN: Procalcitonin: <0.1 ng/mL

## 2020-05-07 LAB — CBC WITH DIFFERENTIAL/PLATELET
Abs Immature Granulocytes: 0.02 10*3/uL (ref 0.00–0.07)
Basophils Absolute: 0 10*3/uL (ref 0.0–0.1)
Basophils Relative: 0 %
Eosinophils Absolute: 0 10*3/uL (ref 0.0–0.5)
Eosinophils Relative: 0 %
HCT: 40 % (ref 36.0–46.0)
Hemoglobin: 14.2 g/dL (ref 12.0–15.0)
Immature Granulocytes: 0 %
Lymphocytes Relative: 20 %
Lymphs Abs: 1 10*3/uL (ref 0.7–4.0)
MCH: 32.9 pg (ref 26.0–34.0)
MCHC: 35.5 g/dL (ref 30.0–36.0)
MCV: 92.8 fL (ref 80.0–100.0)
Monocytes Absolute: 0.3 10*3/uL (ref 0.1–1.0)
Monocytes Relative: 6 %
Neutro Abs: 3.8 10*3/uL (ref 1.7–7.7)
Neutrophils Relative %: 74 %
Platelets: 151 10*3/uL (ref 150–400)
RBC: 4.31 MIL/uL (ref 3.87–5.11)
RDW: 12.3 % (ref 11.5–15.5)
WBC: 5.1 10*3/uL (ref 4.0–10.5)
nRBC: 0 % (ref 0.0–0.2)

## 2020-05-07 LAB — URINALYSIS, ROUTINE W REFLEX MICROSCOPIC
Bacteria, UA: NONE SEEN
Bilirubin Urine: NEGATIVE
Glucose, UA: NEGATIVE mg/dL
Hgb urine dipstick: NEGATIVE
Ketones, ur: NEGATIVE mg/dL
Nitrite: NEGATIVE
Protein, ur: NEGATIVE mg/dL
Specific Gravity, Urine: 1.014 (ref 1.005–1.030)
pH: 6 (ref 5.0–8.0)

## 2020-05-07 LAB — COMPREHENSIVE METABOLIC PANEL
ALT: 27 U/L (ref 0–44)
AST: 48 U/L — ABNORMAL HIGH (ref 15–41)
Albumin: 3.2 g/dL — ABNORMAL LOW (ref 3.5–5.0)
Alkaline Phosphatase: 45 U/L (ref 38–126)
Anion gap: 10 (ref 5–15)
BUN: 11 mg/dL (ref 6–20)
CO2: 28 mmol/L (ref 22–32)
Calcium: 8.2 mg/dL — ABNORMAL LOW (ref 8.9–10.3)
Chloride: 98 mmol/L (ref 98–111)
Creatinine, Ser: 0.52 mg/dL (ref 0.44–1.00)
GFR calc Af Amer: >60 mL/min (ref >60)
GFR calc non Af Amer: >60 mL/min (ref >60)
Glucose, Bld: 119 mg/dL — ABNORMAL HIGH (ref 70–99)
Potassium: 3.1 mmol/L — ABNORMAL LOW (ref 3.5–5.1)
Sodium: 136 mmol/L (ref 135–145)
Total Bilirubin: 0.5 mg/dL (ref 0.3–1.2)
Total Protein: 6.3 g/dL — ABNORMAL LOW (ref 6.5–8.1)

## 2020-05-07 LAB — LACTIC ACID, PLASMA
Lactic Acid, Venous: 1 mmol/L (ref 0.5–1.9)
Lactic Acid, Venous: 0.9 mmol/L (ref 0.5–1.9)

## 2020-05-07 LAB — FIBRINOGEN: Fibrinogen: 650 mg/dL — ABNORMAL HIGH (ref 210–475)

## 2020-05-07 LAB — ABO/RH: ABO/RH(D): A POS

## 2020-05-07 LAB — FERRITIN: Ferritin: 1278 ng/mL — ABNORMAL HIGH (ref 11–307)

## 2020-05-07 LAB — TROPONIN I (HIGH SENSITIVITY)
Troponin I (High Sensitivity): 12 ng/L (ref <18)
Troponin I (High Sensitivity): 13 ng/L (ref <18)

## 2020-05-07 LAB — POC URINE PREG, ED: Preg Test, Ur: NEGATIVE

## 2020-05-07 LAB — D-DIMER, QUANTITATIVE: D-Dimer, Quant: 1 ug/mL-FEU — ABNORMAL HIGH (ref 0.00–0.50)

## 2020-05-07 LAB — TRIGLYCERIDES: Triglycerides: 112 mg/dL (ref <150)

## 2020-05-07 LAB — C-REACTIVE PROTEIN: CRP: 2.2 mg/dL — ABNORMAL HIGH (ref <1.0)

## 2020-05-07 LAB — SARS CORONAVIRUS 2 BY RT PCR (HOSPITAL ORDER, PERFORMED IN ~~LOC~~ HOSPITAL LAB): SARS Coronavirus 2: POSITIVE — AB

## 2020-05-07 LAB — LACTATE DEHYDROGENASE: LDH: 414 U/L — ABNORMAL HIGH (ref 98–192)

## 2020-05-07 MED ORDER — VITAMIN D 25 MCG (1000 UNIT) PO TABS
1000.0000 [IU] | ORAL_TABLET | Freq: Every day | ORAL | Status: DC
  Administered 2020-05-07 – 2020-05-09 (×3): 1000 [IU] via ORAL
  Filled 2020-05-07 (×3): qty 1

## 2020-05-07 MED ORDER — ASCORBIC ACID 500 MG PO TABS
500.0000 mg | ORAL_TABLET | Freq: Every day | ORAL | Status: DC
  Administered 2020-05-07 – 2020-05-09 (×3): 500 mg via ORAL
  Filled 2020-05-07 (×3): qty 1

## 2020-05-07 MED ORDER — SODIUM CHLORIDE 0.9 % IV SOLN
1000.0000 mL | INTRAVENOUS | Status: DC
  Administered 2020-05-07 (×2): 1000 mL via INTRAVENOUS

## 2020-05-07 MED ORDER — METHYLPREDNISOLONE SODIUM SUCC 40 MG IJ SOLR
0.5000 mg/kg | Freq: Two times a day (BID) | INTRAMUSCULAR | Status: DC
  Administered 2020-05-07 – 2020-05-09 (×4): 36 mg via INTRAVENOUS
  Filled 2020-05-07 (×4): qty 1

## 2020-05-07 MED ORDER — SODIUM CHLORIDE 0.9 % IV SOLN
100.0000 mg | Freq: Every day | INTRAVENOUS | Status: DC
  Administered 2020-05-08 – 2020-05-09 (×2): 100 mg via INTRAVENOUS
  Filled 2020-05-07 (×2): qty 20

## 2020-05-07 MED ORDER — DEXAMETHASONE SODIUM PHOSPHATE 10 MG/ML IJ SOLN
10.0000 mg | Freq: Once | INTRAMUSCULAR | Status: AC
  Administered 2020-05-07: 10 mg via INTRAVENOUS
  Filled 2020-05-07: qty 1

## 2020-05-07 MED ORDER — ONDANSETRON HCL 4 MG PO TABS: 4.0000 mg | ORAL_TABLET | Freq: Four times a day (QID) | ORAL | Status: DC | PRN

## 2020-05-07 MED ORDER — SODIUM CHLORIDE 0.9 % IV SOLN
100.0000 mg | INTRAVENOUS | Status: AC
  Administered 2020-05-07 (×2): 100 mg via INTRAVENOUS
  Filled 2020-05-07 (×2): qty 20

## 2020-05-07 MED ORDER — ACETAMINOPHEN 325 MG PO TABS
650.0000 mg | ORAL_TABLET | Freq: Four times a day (QID) | ORAL | Status: DC | PRN
  Administered 2020-05-08 (×2): 650 mg via ORAL
  Filled 2020-05-07: qty 2

## 2020-05-07 MED ORDER — ONDANSETRON HCL 4 MG/2ML IJ SOLN
4.0000 mg | Freq: Four times a day (QID) | INTRAMUSCULAR | Status: DC | PRN
  Administered 2020-05-08: 4 mg via INTRAVENOUS
  Filled 2020-05-07: qty 2

## 2020-05-07 MED ORDER — ZINC SULFATE 220 (50 ZN) MG PO CAPS
220.0000 mg | ORAL_CAPSULE | Freq: Every day | ORAL | Status: DC
  Administered 2020-05-07 – 2020-05-09 (×3): 220 mg via ORAL
  Filled 2020-05-07 (×3): qty 1

## 2020-05-07 MED ORDER — SODIUM CHLORIDE 0.9 % IV SOLN: 100.0000 mg | Freq: Every day | INTRAVENOUS | Status: DC

## 2020-05-07 MED ORDER — ALBUTEROL SULFATE HFA 108 (90 BASE) MCG/ACT IN AERS
2.0000 | INHALATION_SPRAY | Freq: Four times a day (QID) | RESPIRATORY_TRACT | Status: DC
  Administered 2020-05-07 – 2020-05-09 (×7): 2 via RESPIRATORY_TRACT
  Filled 2020-05-07 (×2): qty 6.7

## 2020-05-07 MED ORDER — ALBUTEROL SULFATE HFA 108 (90 BASE) MCG/ACT IN AERS
2.0000 | INHALATION_SPRAY | Freq: Four times a day (QID) | RESPIRATORY_TRACT | Status: DC
  Administered 2020-05-07: 2 via RESPIRATORY_TRACT

## 2020-05-07 MED ORDER — ENOXAPARIN SODIUM 40 MG/0.4ML ~~LOC~~ SOLN
40.0000 mg | SUBCUTANEOUS | Status: DC
  Administered 2020-05-07 – 2020-05-08 (×2): 40 mg via SUBCUTANEOUS
  Filled 2020-05-07 (×2): qty 0.4

## 2020-05-07 MED ORDER — SODIUM CHLORIDE 0.9 % IV SOLN
200.0000 mg | Freq: Once | INTRAVENOUS | Status: DC
  Filled 2020-05-07: qty 40

## 2020-05-07 NOTE — ED Notes (Signed)
Increased to 5lpm due to O2 88%

## 2020-05-07 NOTE — ED Triage Notes (Signed)
Pt reports was diagnosed with covid on Aug 20.  C/O diarrhea, headache, generalized body aches, and sob.

## 2020-05-07 NOTE — H&P (Signed)
History and Physical  Stacey Rowe ALP:[id number] DOB: 1961-07-03 DOA: 05/07/2020   PCP: Kathyrn Drown, MD   Patient coming from: Home  Chief Complaint: sob  HPI:  Stacey Rowe is a 59 y.o. female with medical history of osteoarthritis, basal cell carcinoma of the skin presenting with 2-day history of worsening shortness of breath.  The patient states that she was tested positive for Covid on 04/27/2020 at [facility].  The patient was exposed to her grandchildren who have had positive Covid tests.  She has not had to COVID-19 vaccinations.  Her symptoms actually began approximately 6 to 7 days prior to admission with some nausea, vomiting, and loose stools.  She states that her loose stools and vomiting have actually improved.  She actually feels constipated at this point.  She still has some nausea.  She complains of a headache and has had fevers and chills up to 101.6 F.  She denies any hematemesis, hemoptysis, hematochezia, melena, dysuria, hematuria.  She complains of polyarthralgias and myalgias with her headache. In the emergency department, the patient was afebrile hemodynamically stable.  Oxygen saturation was 87% on room air.  She was placed on 3 L with oxygen saturation 95-96%.  She was started on remdesivir and IV steroids. Chest x-ray shows bilateral basal opacities, right greater than left. Assessment/Plan: Acute respiratory failure secondary to COVID-19 pneumonia -Currently stable on 3 L nasal cannula -Wean oxygen as tolerated -Continue remdesivir -Continue IV steroids -Daily inflammatory markers -CRP 2.2 -Ferritin 1278 -PCT <0.10 -Vitamin C and zinc  Hypokalemia -Replete -Check magnesium  Mild transaminasemia -Secondary to COVID-19 infection -A.m. LFTs  atypcial chest pain -due to cough -troponin 12>>>13 -personally reviewed EKG--sinus, nonspecific TWI       Past Medical History:  Diagnosis Date  . Arthritis   . Cancer ([place])    skin- Basal  cell- back  . Claustrophobia   . Complication of anesthesia   . Constipation   . PONV (postoperative nausea and vomiting)    Past Surgical History:  Procedure Laterality Date  . BACK SURGERY  2005   Lumbar Discectomy  . COLONOSCOPY    . NASAL SEPTUM SURGERY    . NECK SURGERY    . OVARIAN CYST SURGERY  1980's  . RADIOLOGY WITH ANESTHESIA N/A 06/03/2016   Procedure: MRI CERVICAL SPINE WITHOUT CONTRAST;  Surgeon: Medication Radiologist, MD;  Location: [place];  Service: Radiology;  Laterality: N/A;   Social History:  reports that she has never smoked. She has never used smokeless tobacco. She reports previous alcohol use. She reports that she does not use drugs.   Family history:  Reviewed. No pertinent family history  Allergies  Allergen Reactions  . Hydrocodone Itching     Prior to Admission medications   Medication Sig Start Date End Date Taking? Authorizing Provider  albuterol (PROAIR HFA) 108 (90 Base) MCG/ACT inhaler INHALE 2 PUFFS INTO THE LUNGS FOUR TIMES DAILY AS NEEDED FOR WHEEZING 12/15/17   Mikey Kirschner, MD  BIOTIN PO Take 1 tablet by mouth daily.    [provider]  cholecalciferol (VITAMIN D) 1000 units tablet Take 1,000 Units by mouth daily.    [provider]  meloxicam (MOBIC) 15 MG tablet Take 15 mg by mouth 2 (two) times daily as needed for pain. 05/13/16   [provider]  naproxen sodium (ANAPROX) 220 MG tablet Take 440 mg by mouth daily as needed (pain).    [provider]  Omega-3  Fatty Acids (FISH OIL PO) Take 1 capsule by mouth daily.    [provider]  polyethylene glycol (MIRALAX / GLYCOLAX) packet Take 17 g by mouth daily as needed for mild constipation (takes once daily every day and 2nd time only if needed).    [provider]  predniSONE (DELTASONE) 20 MG tablet Three po for three days,then two po for three days,then one po for three days 11/23/17   Mikey Kirschner, MD    Review of Systems:    Constitutional:  No weight loss, night sweats,  Head&Eyes: No headache.  No vision loss.  No eye pain or scotoma ENT:  No Difficulty swallowing,Tooth/dental problems,Sore throat,  No ear ache, post nasal drip,  Cardio-vascular:  No chest pain, Orthopnea, PND, swelling in lower extremities,  dizziness, palpitations  GI:  No  abdominal pain, nausea, vomiting, diarrhea, loss of appetite, hematochezia, melena, heartburn, indigestion, Resp:   No coughing up of blood .No wheezing.No chest wall deformity  Skin:  no rash or lesions.  GU:  no dysuria, change in color of urine, no urgency or frequency. No flank pain.  Musculoskeletal:  No joint pain or swelling. No decreased range of motion. No back pain.  Psych:  No change in mood or affect. No depression or anxiety. Neurologic: No headache, no dysesthesia, no focal weakness, no vision loss. No syncope  Physical Exam: Vitals:   05/07/20 1015 05/07/20 1030 05/07/20 1045 05/07/20 1100  BP:  106/65  116/70  Pulse: 92 90 85 (!) 102  Resp: (!) 27 (!) 26 (!) 27 (!) 28  Temp:      TempSrc:      SpO2: 95% (!) 89% 90% 91%  Weight:      Height:       General:  A&O x 3, NAD, nontoxic, pleasant/cooperative Head/Eye: No conjunctival hemorrhage, no icterus, [place]/AT, No nystagmus ENT:  No icterus,  No thrush, good dentition, no pharyngeal exudate Neck:  No masses, no lymphadenpathy, no bruits CV:  RRR, no rub, no gallop, no S3 Lung:  Bilateral rales. No wheeze Abdomen: soft/NT, +BS, nondistended, no peritoneal signs Ext: No cyanosis, No rashes, No petechiae, No lymphangitis, No edema Neuro: CNII-XII intact, strength 4/5 in bilateral upper and lower extremities, no dysmetria  Labs on Admission:  Basic Metabolic Panel: Recent Labs  Lab 05/07/20 0915  NA 136  K 3.1*  CL 98  CO2 28  GLUCOSE 119*  BUN 11  CREATININE 0.52  CALCIUM 8.2*   Liver Function Tests: Recent Labs  Lab 05/07/20 0915  AST 48*  ALT 27  ALKPHOS 45  BILITOT  0.5  PROT 6.3*  ALBUMIN 3.2*   No results for input(s): LIPASE, AMYLASE in the last 168 hours. No results for input(s): AMMONIA in the last 168 hours. CBC: Recent Labs  Lab 05/07/20 0915  WBC 5.1  NEUTROABS 3.8  HGB 14.2  HCT 40.0  MCV 92.8  PLT 151   Coagulation Profile: No results for input(s): INR, PROTIME in the last 168 hours. Cardiac Enzymes: No results for input(s): CKTOTAL, CKMB, CKMBINDEX, TROPONINI in the last 168 hours. BNP: Invalid input(s): POCBNP CBG: No results for input(s): GLUCAP in the last 168 hours. Urine analysis: No results found for: COLORURINE, APPEARANCEUR, LABSPEC, PHURINE, GLUCOSEU, HGBUR, BILIRUBINUR, KETONESUR, PROTEINUR, UROBILINOGEN, NITRITE, LEUKOCYTESUR Sepsis Labs: @LABRCNTIP (procalcitonin:4,lacticidven:4) )No results found for this or any previous visit (from the past 240 hour(s)).   Radiological Exams on Admission: DG Chest Port 1 View  Result Date: 05/07/2020 CLINICAL  DATA:  COVID, shortness of breath diagnosed with COVID on August 20th EXAM: PORTABLE CHEST 1 VIEW COMPARISON:  None FINDINGS: Signs of ACDF partially visualized at the upper margin of the image. Trachea is midline. Cardiomediastinal contours and hilar structures are normal. Peripheral consolidative changes worse in the mid and lower chest. No sign of pleural effusion. On limited assessment skeletal structures without acute process. IMPRESSION: Multifocal, peripheral and basilar predominant consolidative changes likely related to known COVID-19 infection/pneumonia. Electronically Signed   By: Zetta Bills M.D.   On: 05/07/2020 08:22    EKG: Independently reviewed. Sinus, nonspecific TWI    Time spent:60 minutes Code Status:   FULL Family Communication:  No Family at bedside Disposition Plan: expect 1-2 day hospitalization Consults called: none DVT Prophylaxis: [place] Lovenox  Orson Eva, DO  [facility] Pager [phone]  If 7PM-7AM, please contact  night-coverage [email or web address] Password [email or web address] 05/07/2020, 11:19 AM

## 2020-05-07 NOTE — ED Provider Notes (Signed)
Emergency Department Provider Note   I have reviewed the triage vital signs and the nursing notes.   HISTORY  Chief Complaint Covid   HPI Stacey Rowe is a 59 y.o. female with past medical history reviewed below presents to the emergency department for evaluation of body aches, headache, diarrhea, new shortness of breath in the setting of known COVID-19 infection.  Patient tells me she was exposed to COVID-19 through her grandchildren.  She tested positive on August 20 but was not having symptoms at that time.  Over the past week she is developed diarrhea with body aches and headache.  Over the past 48 hours she has become short of breath and was told at the time her results were given to her that that should be the trigger for her to go to the hospital.  She does not have a history of underlying lung disease.  She does not wear oxygen at home.  She is feeling tightness everywhere including her chest, back, abdomen with no area hurting more than the other.  She has not had diarrhea in the past 24 hours.  She has not been vomiting.  Her appetite has been poor overall.  No radiation of symptoms or other modifying factors.   Past Medical History:  Diagnosis Date  . Arthritis   . Cancer ([place])    skin- Basal cell- back  . Claustrophobia   . Complication of anesthesia   . Constipation   . PONV (postoperative nausea and vomiting)     Patient Active Problem List   Diagnosis Date Noted  . Acute respiratory failure due to COVID-19 ([place]) 05/07/2020  . Acute respiratory failure with hypoxia ([place])   . Lateral epicondylitis of right elbow 07/27/2013    Past Surgical History:  Procedure Laterality Date  . BACK SURGERY  2005   Lumbar Discectomy  . COLONOSCOPY    . NASAL SEPTUM SURGERY    . NECK SURGERY    . OVARIAN CYST SURGERY  1980's  . RADIOLOGY WITH ANESTHESIA N/A 06/03/2016   Procedure: MRI CERVICAL SPINE WITHOUT CONTRAST;  Surgeon: Medication Radiologist, MD;  Location: [place];   Service: Radiology;  Laterality: N/A;    Allergies Hydrocodone and Mucinex dm maximum [dm-guaifenesin er]  History reviewed. No pertinent family history.  Social History Social History   Tobacco Use  . Smoking status: Never Smoker  . Smokeless tobacco: Never Used  Substance Use Topics  . Alcohol use: Not Currently    Comment: ocassional  . Drug use: No    Review of Systems  Constitutional: Positive fever/chills and body aches.  Eyes: No visual changes. ENT: No sore throat. Cardiovascular: Positive chest pain. Respiratory: Positive shortness of breath. Gastrointestinal: No abdominal pain.  No nausea, no vomiting. Positive diarrhea.  No constipation. Genitourinary: Negative for dysuria. Musculoskeletal: Positive for back pain. Skin: Negative for rash. Neurological: Negative for focal weakness or numbness. Positive HA.   10-point ROS otherwise negative.  ____________________________________________   PHYSICAL EXAM:  VITAL SIGNS: ED Triage Vitals  Enc Vitals Group     BP 05/07/20 0725 (!) 94/59     Pulse Rate 05/07/20 0725 (!) 103     Resp 05/07/20 0725 18     Temp 05/07/20 0725 98.2 F (36.8 C)     Temp Source 05/07/20 0725 Oral     SpO2 05/07/20 0725 (!) 87 %     Weight 05/07/20 0722 158 lb (71.7 kg)     Height 05/07/20 0722 5\' 6"  (1.676  m)   Constitutional: Alert and oriented. Well appearing and in no acute distress. Eyes: Conjunctivae are normal.  Head: Atraumatic. Nose: No congestion/rhinnorhea. Mouth/Throat: Mucous membranes are moist.  Cardiovascular: Tachycardia. Good peripheral circulation. Grossly normal heart sounds.   Respiratory: Normal respiratory effort.  No retractions. Lungs CTAB. Gastrointestinal: Soft with diffuse mild tenderness. No focal tenderness, rebound or guarding. No distention.  Musculoskeletal: No lower extremity tenderness nor edema. No gross deformities of extremities. Neurologic:  Normal speech and language. No gross focal  neurologic deficits are appreciated.  Skin:  Skin is warm, dry and intact. No rash noted.   ____________________________________________   LABS (all labs ordered are listed, but only abnormal results are displayed)  Labs Reviewed  SARS CORONAVIRUS 2 BY RT PCR (HOSPITAL ORDER, [place] [facility]) - Abnormal; Notable for the following components:      Result Value   SARS Coronavirus 2 POSITIVE (*)    All other components within normal limits  COMPREHENSIVE METABOLIC PANEL - Abnormal; Notable for the following components:   Potassium 3.1 (*)    Glucose, Bld 119 (*)    Calcium 8.2 (*)    Total Protein 6.3 (*)    Albumin 3.2 (*)    AST 48 (*)    All other components within normal limits  D-DIMER, QUANTITATIVE (NOT AT [facility]) - Abnormal; Notable for the following components:   D-Dimer, Quant 1.00 (*)    All other components within normal limits  LACTATE DEHYDROGENASE - Abnormal; Notable for the following components:   LDH 414 (*)    All other components within normal limits  FERRITIN - Abnormal; Notable for the following components:   Ferritin 1,278 (*)    All other components within normal limits  FIBRINOGEN - Abnormal; Notable for the following components:   Fibrinogen 650 (*)    All other components within normal limits  C-REACTIVE PROTEIN - Abnormal; Notable for the following components:   CRP 2.2 (*)    All other components within normal limits  URINALYSIS, ROUTINE W REFLEX MICROSCOPIC - Abnormal; Notable for the following components:   Leukocytes,Ua TRACE (*)    All other components within normal limits  CBC WITH DIFFERENTIAL/PLATELET - Abnormal; Notable for the following components:   WBC 3.7 (*)    RBC 3.71 (*)    HCT 35.4 (*)    Lymphs Abs 0.4 (*)    All other components within normal limits  COMPREHENSIVE METABOLIC PANEL - Abnormal; Notable for the following components:   Potassium 3.1 (*)    Glucose, Bld 165 (*)    Calcium 8.2 (*)    Total  Protein 5.9 (*)    Albumin 2.7 (*)    All other components within normal limits  C-REACTIVE PROTEIN - Abnormal; Notable for the following components:   CRP 2.9 (*)    All other components within normal limits  D-DIMER, QUANTITATIVE (NOT AT [facility]) - Abnormal; Notable for the following components:   D-Dimer, Quant 0.85 (*)    All other components within normal limits  FERRITIN - Abnormal; Notable for the following components:   Ferritin 1,037 (*)    All other components within normal limits  CBC WITH DIFFERENTIAL/PLATELET - Abnormal; Notable for the following components:   RBC 3.58 (*)    Hemoglobin 11.7 (*)    HCT 34.3 (*)    Neutro Abs 7.8 (*)    Abs Immature Granulocytes 0.08 (*)    All other components within normal limits  COMPREHENSIVE METABOLIC  PANEL - Abnormal; Notable for the following components:   Glucose, Bld 147 (*)    Calcium 8.2 (*)    Total Protein 5.5 (*)    Albumin 2.6 (*)    Alkaline Phosphatase 36 (*)    All other components within normal limits  D-DIMER, QUANTITATIVE (NOT AT [facility]) - Abnormal; Notable for the following components:   D-Dimer, Quant 1.05 (*)    All other components within normal limits  CULTURE, BLOOD (ROUTINE X 2)  CULTURE, BLOOD (ROUTINE X 2)  LACTIC ACID, PLASMA  LACTIC ACID, PLASMA  CBC WITH DIFFERENTIAL/PLATELET  PROCALCITONIN  TRIGLYCERIDES  HIV ANTIBODY (ROUTINE TESTING W REFLEX)  MAGNESIUM  C-REACTIVE PROTEIN  FERRITIN  POC URINE PREG, ED  ABO/RH  TROPONIN I (HIGH SENSITIVITY)  TROPONIN I (HIGH SENSITIVITY)   ____________________________________________  EKG   EKG Interpretation  Date/Time:  Monday May 07 2020 07:37:05 EDT Ventricular Rate:  86 PR Interval:    QRS Duration: 78 QT Interval:  383 QTC Calculation: 459 R Axis:   -13 Text Interpretation: Sinus rhythm Consider right atrial enlargement No STEMI Confirmed by Nanda Quinton [phone]) on 05/07/2020 7:56:57 AM        ____________________________________________  RADIOLOGY  CXR reviewed.  ____________________________________________   PROCEDURES  Procedure(s) performed:   Procedures  CRITICAL CARE Performed by: Margette Fast Total critical care time: 35 minutes Critical care time was exclusive of separately billable procedures and treating other patients. Critical care was necessary to treat or prevent imminent or life-threatening deterioration. Critical care was time spent personally by me on the following activities: development of treatment plan with patient and/or surrogate as well as nursing, discussions with consultants, evaluation of patient's response to treatment, examination of patient, obtaining history from patient or surrogate, ordering and performing treatments and interventions, ordering and review of laboratory studies, ordering and review of radiographic studies, pulse oximetry and re-evaluation of patient's condition.  Nanda Quinton, MD Emergency Medicine  ____________________________________________   INITIAL IMPRESSION / ASSESSMENT AND PLAN / ED COURSE  Pertinent labs & imaging results that were available during my care of the patient were reviewed by me and considered in my medical decision making (see chart for details).   Patient presents emergency department with COVID-19-like symptoms with developing shortness of breath.  She is hypoxemic on arrival to 87% on room air.  She does have mild tachycardia.  PE is a consideration but will obtain D-dimer and other COVID-19 admission labs.  Patient currently comfortable on 2 L nasal cannula oxygen and satting in the upper 90s.  Abdomen is mildly tender throughout but no area of focal tenderness.  Hold on CT imaging at this time.  Plan for troponin as she is also complaining of some chest tightness but seems more consistent with diffuse muscle aches as opposed to ACS.   09:00 AM  CXR reviewed showing multifocal PNA. Starting  Decadron and Remdesivir. Labs pending.    Discussed patient's case with [facility] to request admission. Patient and family (if present) updated with plan. Care transferred to [facility] service.  I reviewed all nursing notes, vitals, pertinent old records, EKGs, labs, imaging (as available).  ____________________________________________  FINAL CLINICAL IMPRESSION(S) / ED DIAGNOSES  Final diagnoses:  Acute respiratory failure with hypoxia (HCC)  COVID-19     MEDICATIONS GIVEN DURING THIS VISIT:  Medications  cholecalciferol (VITAMIN D3) tablet 1,000 Units (1,000 Units Oral Given 05/08/20 0809)  enoxaparin (LOVENOX) injection 40 mg (40 mg Subcutaneous Given 05/08/20 2234)  methylPREDNISolone sodium succinate (SOLU-MEDROL)  40 mg/mL injection 36 mg (36 mg Intravenous Given 05/09/20 0700)  acetaminophen (TYLENOL) tablet 650 mg (650 mg Oral Given 05/08/20 2256)  ondansetron (ZOFRAN) tablet 4 mg ( Oral See Alternative 05/08/20 1911)    Or  ondansetron (ZOFRAN) injection 4 mg (4 mg Intravenous Given 05/08/20 1911)  ascorbic acid (VITAMIN C) tablet 500 mg (500 mg Oral Given 05/08/20 0810)  zinc sulfate capsule 220 mg (220 mg Oral Given 05/08/20 1000)  albuterol (VENTOLIN HFA) 108 (90 Base) MCG/ACT inhaler 2 puff (2 puffs Inhalation Not Given 05/09/20 0200)  remdesivir 100 mg in sodium chloride 0.9 % 100 mL IVPB (100 mg Intravenous New Bag/Given 05/08/20 0927)  0.9 %  sodium chloride infusion (1,000 mLs Intravenous New Bag/Given 05/08/20 1301)  benzonatate (TESSALON) capsule 100 mg (100 mg Oral Given 05/08/20 2254)  dexamethasone (DECADRON) injection 10 mg (10 mg Intravenous Given 05/07/20 0925)  remdesivir 100 mg in sodium chloride 0.9 % 100 mL IVPB (0 mg Intravenous Stopped 05/07/20 1330)  potassium chloride SA (KLOR-CON) CR tablet 40 mEq (40 mEq Oral Given 05/08/20 1302)    Note:  This document was prepared using Dragon voice recognition software and may include unintentional dictation errors.  Nanda Quinton, MD,  [facility] Emergency Medicine    Ethelmae Ringel, [facility], MD 05/09/20 [phone]

## 2020-05-07 NOTE — Telephone Encounter (Signed)
Patient stated she is currently being admitted to [facility] with hypoxia related to Covid and told them to send you her records.

## 2020-05-08 LAB — CBC WITH DIFFERENTIAL/PLATELET
Abs Immature Granulocytes: 0.02 10*3/uL (ref 0.00–0.07)
Basophils Absolute: 0 10*3/uL (ref 0.0–0.1)
Basophils Relative: 0 %
Eosinophils Absolute: 0 10*3/uL (ref 0.0–0.5)
Eosinophils Relative: 0 %
HCT: 35.4 % — ABNORMAL LOW (ref 36.0–46.0)
Hemoglobin: 12.2 g/dL (ref 12.0–15.0)
Immature Granulocytes: 1 %
Lymphocytes Relative: 11 %
Lymphs Abs: 0.4 10*3/uL — ABNORMAL LOW (ref 0.7–4.0)
MCH: 32.9 pg (ref 26.0–34.0)
MCHC: 34.5 g/dL (ref 30.0–36.0)
MCV: 95.4 fL (ref 80.0–100.0)
Monocytes Absolute: 0.3 10*3/uL (ref 0.1–1.0)
Monocytes Relative: 8 %
Neutro Abs: 3 10*3/uL (ref 1.7–7.7)
Neutrophils Relative %: 80 %
Platelets: 163 10*3/uL (ref 150–400)
RBC: 3.71 MIL/uL — ABNORMAL LOW (ref 3.87–5.11)
RDW: 12.5 % (ref 11.5–15.5)
WBC: 3.7 10*3/uL — ABNORMAL LOW (ref 4.0–10.5)
nRBC: 0 % (ref 0.0–0.2)

## 2020-05-08 LAB — COMPREHENSIVE METABOLIC PANEL
ALT: 23 U/L (ref 0–44)
AST: 32 U/L (ref 15–41)
Albumin: 2.7 g/dL — ABNORMAL LOW (ref 3.5–5.0)
Alkaline Phosphatase: 39 U/L (ref 38–126)
Anion gap: 9 (ref 5–15)
BUN: 13 mg/dL (ref 6–20)
CO2: 24 mmol/L (ref 22–32)
Calcium: 8.2 mg/dL — ABNORMAL LOW (ref 8.9–10.3)
Chloride: 108 mmol/L (ref 98–111)
Creatinine, Ser: 0.5 mg/dL (ref 0.44–1.00)
GFR calc Af Amer: >60 mL/min (ref >60)
GFR calc non Af Amer: >60 mL/min (ref >60)
Glucose, Bld: 165 mg/dL — ABNORMAL HIGH (ref 70–99)
Potassium: 3.1 mmol/L — ABNORMAL LOW (ref 3.5–5.1)
Sodium: 141 mmol/L (ref 135–145)
Total Bilirubin: 0.4 mg/dL (ref 0.3–1.2)
Total Protein: 5.9 g/dL — ABNORMAL LOW (ref 6.5–8.1)

## 2020-05-08 LAB — HIV ANTIBODY (ROUTINE TESTING W REFLEX): HIV Screen 4th Generation wRfx: NONREACTIVE

## 2020-05-08 LAB — C-REACTIVE PROTEIN: CRP: 2.9 mg/dL — ABNORMAL HIGH (ref <1.0)

## 2020-05-08 LAB — D-DIMER, QUANTITATIVE: D-Dimer, Quant: 0.85 ug/mL-FEU — ABNORMAL HIGH (ref 0.00–0.50)

## 2020-05-08 LAB — FERRITIN: Ferritin: 1037 ng/mL — ABNORMAL HIGH (ref 11–307)

## 2020-05-08 MED ORDER — POTASSIUM CHLORIDE CRYS ER 20 MEQ PO TBCR
40.0000 meq | EXTENDED_RELEASE_TABLET | Freq: Once | ORAL | Status: AC
  Administered 2020-05-08: 40 meq via ORAL
  Filled 2020-05-08: qty 2

## 2020-05-08 MED ORDER — SODIUM CHLORIDE 0.9 % IV SOLN
1000.0000 mL | INTRAVENOUS | Status: AC
  Administered 2020-05-08: 1000 mL via INTRAVENOUS

## 2020-05-08 MED ORDER — BENZONATATE 100 MG PO CAPS
100.0000 mg | ORAL_CAPSULE | Freq: Three times a day (TID) | ORAL | Status: DC | PRN
  Administered 2020-05-08: 100 mg via ORAL
  Filled 2020-05-08: qty 1

## 2020-05-08 NOTE — Plan of Care (Signed)
  Problem: Education: Goal: Knowledge of General Education information will improve Description: Including pain rating scale, medication(s)/side effects and non-pharmacologic comfort measures Outcome: Progressing   Problem: Health Behavior/Discharge Planning: Goal: Ability to manage health-related needs will improve Outcome: Progressing   Problem: Clinical Measurements: Goal: Diagnostic test results will improve Outcome: Progressing   Problem: Clinical Measurements: Goal: Respiratory complications will improve Outcome: Progressing   Problem: Activity: Goal: Risk for activity intolerance will decrease Outcome: Progressing   Problem: Skin Integrity: Goal: Risk for impaired skin integrity will decrease Outcome: Progressing   Problem: Safety: Goal: Ability to remain free from injury will improve Outcome: Progressing   Problem: Coping: Goal: Psychosocial and spiritual needs will be supported Outcome: Progressing   Problem: Education: Goal: Knowledge of risk factors and measures for prevention of condition will improve Outcome: Progressing

## 2020-05-08 NOTE — Progress Notes (Signed)
PROGRESS NOTE    Stacey Rowe  QPY:[id number] DOB: November 03, 1960 DOA: 05/07/2020 PCP: Kathyrn Drown, MD   Brief Narrative:  Per HPI: Stacey Rowe is a 59 y.o. female with medical history of osteoarthritis, basal cell carcinoma of the skin presenting with 2-day history of worsening shortness of breath.  The patient states that she was tested positive for Covid on 04/27/2020 at [facility]  The patient was exposed to her grandchildren who have had positive Covid tests.  She has not had to COVID-19 vaccinations.  Her symptoms actually began approximately 6 to 7 days prior to admission with some nausea, vomiting, and loose stools.  She states that her loose stools and vomiting have actually improved.  She actually feels constipated at this point.  She still has some nausea.  She complains of a headache and has had fevers and chills up to 101.6 F.  She denies any hematemesis, hemoptysis, hematochezia, melena, dysuria, hematuria.  She complains of polyarthralgias and myalgias with her headache. In the emergency department, the patient was afebrile hemodynamically stable.  Oxygen saturation was 87% on room air.  She was placed on 3 L with oxygen saturation 95-96%.  She was started on remdesivir and IV steroids. Chest x-ray shows bilateral basal opacities, right greater than left.  8/31: Patient admitted with acute hypoxemic respiratory failure in the setting of Covid pneumonia and has been started on steroids as well as remdesivir.  She is noted to have some ongoing mild hypokalemia which will be repleted.  Inflammatory markers mostly stable and will need ongoing monitoring.  Assessment & Plan:   Active Problems:   Acute respiratory failure due to COVID-19 [facility])    Acute respiratory failure secondary to COVID-19 pneumonia -Currently stable on 4 L nasal cannula -Wean oxygen as tolerated -Continue remdesivir -Continue IV steroids -Daily inflammatory markers ordered -CRP 2.9 -Ferritin  1037 -PCT <0.10 -Vitamin C and zinc  Hypokalemia -Replete -Check magnesium  Mild transaminasemia-downtrending -Secondary to COVID-19 infection -A.m. LFTs  atypcial chest pain -due to cough -troponin 12>>>13 -personally reviewed EKG--sinus, nonspecific TWI  DVT prophylaxis: Lovenox Code Status: Full code Family Communication: Patient states she will call Disposition Plan:  Status is: Inpatient  Remains inpatient appropriate because:IV treatments appropriate due to intensity of illness or inability to take PO and Inpatient level of care appropriate due to severity of illness   Dispo: The patient is from: Home              Anticipated d/c is to: Home              Anticipated d/c date is: 1 day              Patient currently is not medically stable to d/c.  Consultants:   None  Procedures:   See below  Antimicrobials:  Anti-infectives (From admission, onward)   Start     Dose/Rate Route Frequency Ordered Stop   05/08/20 1000  remdesivir 100 mg in sodium chloride 0.9 % 100 mL IVPB  Status:  Discontinued        100 mg 200 mL/hr over 30 Minutes Intravenous Daily 05/07/20 0916 05/07/20 1922   05/08/20 1000  remdesivir 100 mg in sodium chloride 0.9 % 100 mL IVPB  Status:  Discontinued       "Followed by" Linked Group Details   100 mg 200 mL/hr over 30 Minutes Intravenous Daily 05/07/20 1915 05/07/20 2053   05/08/20 1000  remdesivir 100 mg in sodium chloride 0.9 %  100 mL IVPB        100 mg 200 mL/hr over 30 Minutes Intravenous Daily 05/07/20 2054 05/12/20 0959   05/07/20 2030  remdesivir 200 mg in sodium chloride 0.9% 250 mL IVPB  Status:  Discontinued       "Followed by" Linked Group Details   200 mg 580 mL/hr over 30 Minutes Intravenous Once 05/07/20 1915 05/07/20 2053   05/07/20 1000  remdesivir 100 mg in sodium chloride 0.9 % 100 mL IVPB        100 mg 200 mL/hr over 30 Minutes Intravenous Every 1 hr x 2 05/07/20 0916 05/07/20 1330      Subjective: Patient  seen and evaluated today with ongoing cough and mild dyspnea.  She overall does not appear to feel well.  Currently on 4 L nasal cannula oxygen.  Objective: Vitals:   05/07/20 2306 05/07/20 2352 05/08/20 0010 05/08/20 0515  BP:   (!) 117/58 109/71  Pulse:   87 87  Resp:   16 14  Temp: 98.6 F (37 C)  97.7 F (36.5 C) 98.4 F (36.9 C)  TempSrc: Oral  Oral Oral  SpO2:   90% 92%  Weight:  69.9 kg    Height:  5\' 6"  (1.676 m)      Intake/Output Summary (Last 24 hours) at 05/08/2020 1046 Last data filed at 05/07/2020 1330 Gross per 24 hour  Intake 194.58 ml  Output --  Net 194.58 ml   Filed Weights   05/07/20 0722 05/07/20 2352  Weight: 71.7 kg 69.9 kg    Examination:  General exam: Appears calm and comfortable  Respiratory system: Clear to auscultation. Respiratory effort normal.  Currently on 4 L nasal cannula oxygen. Cardiovascular system: S1 & S2 heard, RRR. Gastrointestinal system: Abdomen is nondistended, soft and nontender. Central nervous system: Alert and oriented. No focal neurological deficits. Extremities: No edema Skin: No rashes, lesions or ulcers Psychiatry: Flat affect    Data Reviewed: I have personally reviewed following labs and imaging studies  CBC: Recent Labs  Lab 05/07/20 0915 05/08/20 0645  WBC 5.1 3.7*  NEUTROABS 3.8 3.0  HGB 14.2 12.2  HCT 40.0 35.4*  MCV 92.8 95.4  PLT 151 725   Basic Metabolic Panel: Recent Labs  Lab 05/07/20 0915 05/08/20 0645  NA 136 141  K 3.1* 3.1*  CL 98 108  CO2 28 24  GLUCOSE 119* 165*  BUN 11 13  CREATININE 0.52 0.50  CALCIUM 8.2* 8.2*   GFR: Estimated Creatinine Clearance: 70 mL/min (by C-G formula based on SCr of 0.5 mg/dL). Liver Function Tests: Recent Labs  Lab 05/07/20 0915 05/08/20 0645  AST 48* 32  ALT 27 23  ALKPHOS 45 39  BILITOT 0.5 0.4  PROT 6.3* 5.9*  ALBUMIN 3.2* 2.7*   No results for input(s): LIPASE, AMYLASE in the last 168 hours. No results for input(s): AMMONIA in the  last 168 hours. Coagulation Profile: No results for input(s): INR, PROTIME in the last 168 hours. Cardiac Enzymes: No results for input(s): CKTOTAL, CKMB, CKMBINDEX, TROPONINI in the last 168 hours. BNP (last 3 results) No results for input(s): PROBNP in the last 8760 hours. HbA1C: No results for input(s): HGBA1C in the last 72 hours. CBG: No results for input(s): GLUCAP in the last 168 hours. Lipid Profile: Recent Labs    05/07/20 0915  TRIG 112   Thyroid Function Tests: No results for input(s): TSH, T4TOTAL, FREET4, T3FREE, THYROIDAB in the last 72 hours. Anemia Panel: Recent Labs  05/07/20 0915 05/08/20 0645  FERRITIN 1,278* 1,037*   Sepsis Labs: Recent Labs  Lab 05/07/20 0915 05/07/20 1017  PROCALCITON <0.10  --   LATICACIDVEN 1.0 0.9    Recent Results (from the past 240 hour(s))  SARS Coronavirus 2 by RT PCR (hospital order, performed in [facility] hospital lab) Nasopharyngeal Nasopharyngeal Swab     Status: Abnormal   Collection Time: 05/07/20  9:15 AM   Specimen: Nasopharyngeal Swab  Result Value Ref Range Status   SARS Coronavirus 2 POSITIVE (A) NEGATIVE Final    Comment: RESULT CALLED TO, READ BACK BY AND VERIFIED WITH: HOLCOMB,R. AT 4431 ON 05/07/2020 BY BAUGHAM,M. (NOTE) SARS-CoV-2 target nucleic acids are DETECTED  SARS-CoV-2 RNA is generally detectable in upper respiratory specimens  during the acute phase of infection.  Positive results are indicative  of the presence of the identified virus, but do not rule out bacterial infection or co-infection with other pathogens not detected by the test.  Clinical correlation with patient history and  other diagnostic information is necessary to determine patient infection status.  The expected result is negative.  Fact Sheet for Patients:   StrictlyIdeas.no   Fact Sheet for Healthcare Providers:   BankingDealers.co.za    This test is not yet approved or  cleared by the [place] FDA and  has been authorized for detection and/or diagnosis of SARS-CoV-2 by FDA under an Emergency Use Authorization (EUA).  This EUA will remain in effect (mea ning this test can be used) for the duration of  the COVID-19 declaration under Section 564(b)(1) of the Act, 21 U.S.C. section 360-bbb-3(b)(1), unless the authorization is terminated or revoked sooner.  Performed at [facility], [address], [place], [place] [address]   Blood Culture (routine x 2)     Status: None (Preliminary result)   Collection Time: 05/07/20  9:15 AM   Specimen: Left Antecubital; Blood  Result Value Ref Range Status   Specimen Description   Final    LEFT ANTECUBITAL BOTTLES DRAWN AEROBIC AND ANAEROBIC   Special Requests Blood Culture adequate volume  Final   Culture   Final    NO GROWTH < 24 HOURS Performed at [facility], [address] [place], [place] [address]    Report Status PENDING  Incomplete  Blood Culture (routine x 2)     Status: None (Preliminary result)   Collection Time: 05/07/20 10:16 AM   Specimen: Right Antecubital; Blood  Result Value Ref Range Status   Specimen Description   Final    RIGHT ANTECUBITAL BOTTLES DRAWN AEROBIC AND ANAEROBIC   Special Requests Blood Culture adequate volume  Final   Culture   Final    NO GROWTH < 24 HOURS Performed at [facility], [address] [place], [place] [address]    Report Status PENDING  Incomplete         Radiology Studies: DG Chest Port 1 View  Result Date: 05/07/2020 CLINICAL DATA:  COVID, shortness of breath diagnosed with COVID on August 20th EXAM: PORTABLE CHEST 1 VIEW COMPARISON:  None FINDINGS: Signs of ACDF partially visualized at the upper margin of the image. Trachea is midline. Cardiomediastinal contours and hilar structures are normal. Peripheral consolidative changes worse in the mid and lower chest. No sign of pleural effusion. On limited assessment skeletal structures without acute  process. IMPRESSION: Multifocal, peripheral and basilar predominant consolidative changes likely related to known COVID-19 infection/pneumonia. Electronically Signed   By: Zetta Bills M.D.   On: 05/07/2020 08:22  Scheduled Meds: . albuterol  2 puff Inhalation Q6H  . vitamin C  500 mg Oral Daily  . cholecalciferol  1,000 Units Oral Daily  . enoxaparin (LOVENOX) injection  40 mg Subcutaneous Q24H  . methylPREDNISolone (SOLU-MEDROL) injection  0.5 mg/kg Intravenous Q12H  . zinc sulfate  220 mg Oral Daily   Continuous Infusions: . sodium chloride 1,000 mL (05/07/20 1918)  . remdesivir 100 mg in NS 100 mL 100 mg (05/08/20 0927)     LOS: 1 day    Time spent: 35 minutes    Sylvia Helms D Manuella Ghazi, DO [facility]  If 7PM-7AM, please contact night-coverage [email or web address] 05/08/2020, 10:46 AM

## 2020-05-08 NOTE — Progress Notes (Signed)
Patient taught lateral repositioning and proning techniques. Patient is unable to Prone but does continue to reposition laterally.  Elodia Florence RN

## 2020-05-09 LAB — COMPREHENSIVE METABOLIC PANEL
ALT: 25 U/L (ref 0–44)
AST: 31 U/L (ref 15–41)
Albumin: 2.6 g/dL — ABNORMAL LOW (ref 3.5–5.0)
Alkaline Phosphatase: 36 U/L — ABNORMAL LOW (ref 38–126)
Anion gap: 11 (ref 5–15)
BUN: 15 mg/dL (ref 6–20)
CO2: 23 mmol/L (ref 22–32)
Calcium: 8.2 mg/dL — ABNORMAL LOW (ref 8.9–10.3)
Chloride: 109 mmol/L (ref 98–111)
Creatinine, Ser: 0.5 mg/dL (ref 0.44–1.00)
GFR calc Af Amer: >60 mL/min (ref >60)
GFR calc non Af Amer: >60 mL/min (ref >60)
Glucose, Bld: 147 mg/dL — ABNORMAL HIGH (ref 70–99)
Potassium: 3.7 mmol/L (ref 3.5–5.1)
Sodium: 143 mmol/L (ref 135–145)
Total Bilirubin: 0.5 mg/dL (ref 0.3–1.2)
Total Protein: 5.5 g/dL — ABNORMAL LOW (ref 6.5–8.1)

## 2020-05-09 LAB — CBC WITH DIFFERENTIAL/PLATELET
Abs Immature Granulocytes: 0.08 10*3/uL — ABNORMAL HIGH (ref 0.00–0.07)
Basophils Absolute: 0 10*3/uL (ref 0.0–0.1)
Basophils Relative: 0 %
Eosinophils Absolute: 0 10*3/uL (ref 0.0–0.5)
Eosinophils Relative: 0 %
HCT: 34.3 % — ABNORMAL LOW (ref 36.0–46.0)
Hemoglobin: 11.7 g/dL — ABNORMAL LOW (ref 12.0–15.0)
Immature Granulocytes: 1 %
Lymphocytes Relative: 8 %
Lymphs Abs: 0.7 10*3/uL (ref 0.7–4.0)
MCH: 32.7 pg (ref 26.0–34.0)
MCHC: 34.1 g/dL (ref 30.0–36.0)
MCV: 95.8 fL (ref 80.0–100.0)
Monocytes Absolute: 0.4 10*3/uL (ref 0.1–1.0)
Monocytes Relative: 5 %
Neutro Abs: 7.8 10*3/uL — ABNORMAL HIGH (ref 1.7–7.7)
Neutrophils Relative %: 86 %
Platelets: 204 10*3/uL (ref 150–400)
RBC: 3.58 MIL/uL — ABNORMAL LOW (ref 3.87–5.11)
RDW: 12.9 % (ref 11.5–15.5)
WBC: 9 10*3/uL (ref 4.0–10.5)
nRBC: 0 % (ref 0.0–0.2)

## 2020-05-09 LAB — D-DIMER, QUANTITATIVE: D-Dimer, Quant: 1.05 ug/mL-FEU — ABNORMAL HIGH (ref 0.00–0.50)

## 2020-05-09 LAB — C-REACTIVE PROTEIN: CRP: 0.7 mg/dL (ref <1.0)

## 2020-05-09 LAB — FERRITIN: Ferritin: 951 ng/mL — ABNORMAL HIGH (ref 11–307)

## 2020-05-09 LAB — MAGNESIUM: Magnesium: 2.2 mg/dL (ref 1.7–2.4)

## 2020-05-09 MED ORDER — DEXAMETHASONE 6 MG PO TABS: 6.0000 mg | ORAL_TABLET | Freq: Every day | ORAL | 0 refills | Status: AC

## 2020-05-09 MED ORDER — ACETAMINOPHEN 325 MG PO TABS: 650.0000 mg | ORAL_TABLET | Freq: Four times a day (QID) | ORAL | Status: AC | PRN

## 2020-05-09 MED ORDER — ALBUTEROL SULFATE HFA 108 (90 BASE) MCG/ACT IN AERS: 2.0000 | INHALATION_SPRAY | RESPIRATORY_TRACT | 2 refills | Status: DC | PRN

## 2020-05-09 MED ORDER — BENZONATATE 100 MG PO CAPS: 100.0000 mg | ORAL_CAPSULE | Freq: Three times a day (TID) | ORAL | 0 refills | Status: DC | PRN

## 2020-05-09 MED ORDER — ASCORBIC ACID 500 MG PO TABS
500.0000 mg | ORAL_TABLET | Freq: Every day | ORAL | 0 refills | Status: DC
Start: 2020-05-09 — End: 2020-05-09

## 2020-05-09 MED ORDER — ZINC SULFATE 220 (50 ZN) MG PO CAPS
220.0000 mg | ORAL_CAPSULE | Freq: Every day | ORAL | 0 refills | Status: DC
Start: 2020-05-09 — End: 2020-05-09

## 2020-05-09 MED ORDER — ASCORBIC ACID 500 MG PO TABS: 500.0000 mg | ORAL_TABLET | Freq: Every day | ORAL | 0 refills | Status: AC

## 2020-05-09 MED ORDER — ALBUTEROL SULFATE HFA 108 (90 BASE) MCG/ACT IN AERS: 2.0000 | INHALATION_SPRAY | RESPIRATORY_TRACT | 2 refills | Status: AC | PRN

## 2020-05-09 MED ORDER — DEXAMETHASONE 6 MG PO TABS: 6.0000 mg | ORAL_TABLET | Freq: Every day | ORAL | 0 refills | Status: DC

## 2020-05-09 MED ORDER — ZINC SULFATE 220 (50 ZN) MG PO CAPS: 220.0000 mg | ORAL_CAPSULE | Freq: Every day | ORAL | 0 refills | Status: AC

## 2020-05-09 MED ORDER — ACETAMINOPHEN 325 MG PO TABS: 650.0000 mg | ORAL_TABLET | Freq: Four times a day (QID) | ORAL | Status: DC | PRN

## 2020-05-09 NOTE — Discharge Summary (Signed)
Physician Discharge Summary  Stacey Rowe GYK:[id number] DOB: 07/02/1961 DOA: 05/07/2020  PCP: Kathyrn Drown, MD  Admit date: 05/07/2020 Discharge date: 05/09/2020  Admitted From:  Home  Disposition: Home   Recommendations for Outpatient Follow-up:  1. Follow up with PCP in 2 weeks 2. Please scheduled for outpatient remdesivir infusions x 2 additional treatments to complete full course   Patient scheduled for outpatient Remdesivir infusions at 11am on Wednesday 9/2 and Thursday 9/3 at [facility]. Please inform the patient to park [place], [place], as staff will be escorting the patient through the [place] entrance of the hospital.  There is a wave flag banner located near the entrance on [address]. Turn into this entranceand immediatelyturn left and park in 1 of the 5 designated Covid Infusion Parking spots. There is a phone number on the sign, please call and let the staff know what spot you are in and we will come out and get you. For questions call [phone]. Thanks.  Discharge Condition: STABLE   CODE STATUS: FULL    Brief Hospitalization Summary: Please see all hospital notes, images, labs for full details of the hospitalization. ADMISSION HPI:  Stacey Rowe is a 59 y.o. female with medical history of osteoarthritis, basal cell carcinoma of the skin presenting with 2-day history of worsening shortness of breath.  The patient states that she was tested positive for Covid on 04/27/2020 at [facility].  The patient was exposed to her grandchildren who have had positive Covid tests.  She has not had 2 COVID-19 vaccinations.  Her symptoms actually began approximately 6 to 7 days prior to admission with some nausea, vomiting, and loose stools.  She states that her loose stools and vomiting have actually improved.  She actually feels constipated at this point.  She still has some nausea.  She complains of a headache and has had fevers and chills up to 101.6 F.   She denies any hematemesis, hemoptysis, hematochezia, melena, dysuria, hematuria.  She complains of polyarthralgias and myalgias with her headache.  In the emergency department, the patient was afebrile hemodynamically stable.  Oxygen saturation was 87% on room air.  She was placed on 3 L with oxygen saturation 95-96%.  She was started on remdesivir and IV steroids.  Chest x-ray shows bilateral basal opacities, right greater than left.    Hospital Course:  Pt was admitted with Covid pneumonia and started on IV steroids and remdesivir and supportive therapies.  We followed inflammatory markers as they have trended down and repleted electrolytes as needed. She tolerated these therapies well.  She is feeling better today. She remains on supplemental oxygen and will be discharged home on 4LNC.  She will complete her final 2 remdesivir treatments outpatient as arranged and noted above and I asked for transportation assistance for her as well.  She has been ambulating in room.  She is reporting that she feels well to go home.  Follow up with PCP on 2 weeks.  Covid precautions discussed and she verbalized understanding.    Discharge Diagnoses:  Active Problems:   Acute respiratory failure due to COVID-19 [facility])  Discharge Instructions:  Allergies as of 05/09/2020      Reactions   Hydrocodone Itching   Mucinex Dm Maximum [dm-guaifenesin Er] Nausea And Vomiting      Medication List    STOP taking these medications   predniSONE 20 MG tablet Commonly known as: DELTASONE     TAKE these medications   acetaminophen  325 MG tablet Commonly known as: TYLENOL Take 2 tablets (650 mg total) by mouth every 6 (six) hours as needed for mild pain or headache (fever >/= 101).   albuterol 108 (90 Base) MCG/ACT inhaler Commonly known as: ProAir HFA Inhale 2 puffs into the lungs every 4 (four) hours as needed for wheezing or shortness of breath. What changed:   how much to take  how to take this  when to take  this  reasons to take this  additional instructions   ascorbic acid 500 MG tablet Commonly known as: VITAMIN C Take 1 tablet (500 mg total) by mouth daily.   B-12 5000 MCG Caps Take 1 tablet by mouth daily.   benzonatate 100 MG capsule Commonly known as: TESSALON Take 1 capsule (100 mg total) by mouth 3 (three) times daily as needed for cough.   cholecalciferol 1000 units tablet Commonly known as: VITAMIN D Take 1,000 Units by mouth daily.   dexamethasone 6 MG tablet Commonly known as: DECADRON Take 1 tablet (6 mg total) by mouth daily for 8 days.   EPINEPHrine 0.3 mg/0.3 mL Soaj injection Commonly known as: EPI-PEN Inject into the muscle.   famotidine 20 MG tablet Commonly known as: PEPCID Take by mouth.   FISH OIL PO Take 1 capsule by mouth daily.   multivitamin with minerals Tabs tablet Take 1 tablet by mouth daily.   naproxen sodium 220 MG tablet Commonly known as: ALEVE Take 440 mg by mouth daily as needed (pain).   polyethylene glycol 17 g packet Commonly known as: MIRALAX / GLYCOLAX Take 17 g by mouth daily as needed for mild constipation (takes once daily every day and 2nd time only if needed).   valACYclovir 1000 MG tablet Commonly known as: VALTREX Take 1,000-2,000 mg by mouth See admin instructions.   zinc sulfate 220 (50 Zn) MG capsule Take 1 capsule (220 mg total) by mouth daily.   zolpidem 5 MG tablet Commonly known as: AMBIEN Take 5 mg by mouth daily as needed for sleep.       Follow-up Information    Luking, Elayne Snare, MD. Schedule an appointment as soon as possible for a visit in 2 week(s).   Specialty: Family Medicine Contact information: [place] [address] [phone]              Allergies  Allergen Reactions  . Hydrocodone Itching  . Mucinex Dm Maximum [Dm-Guaifenesin Er] Nausea And Vomiting   Allergies as of 05/09/2020      Reactions   Hydrocodone Itching   Mucinex Dm Maximum  [dm-guaifenesin Er] Nausea And Vomiting      Medication List    STOP taking these medications   predniSONE 20 MG tablet Commonly known as: DELTASONE     TAKE these medications   acetaminophen 325 MG tablet Commonly known as: TYLENOL Take 2 tablets (650 mg total) by mouth every 6 (six) hours as needed for mild pain or headache (fever >/= 101).   albuterol 108 (90 Base) MCG/ACT inhaler Commonly known as: ProAir HFA Inhale 2 puffs into the lungs every 4 (four) hours as needed for wheezing or shortness of breath. What changed:   how much to take  how to take this  when to take this  reasons to take this  additional instructions   ascorbic acid 500 MG tablet Commonly known as: VITAMIN C Take 1 tablet (500 mg total) by mouth daily.   B-12 5000 MCG Caps Take 1  tablet by mouth daily.   benzonatate 100 MG capsule Commonly known as: TESSALON Take 1 capsule (100 mg total) by mouth 3 (three) times daily as needed for cough.   cholecalciferol 1000 units tablet Commonly known as: VITAMIN D Take 1,000 Units by mouth daily.   dexamethasone 6 MG tablet Commonly known as: DECADRON Take 1 tablet (6 mg total) by mouth daily for 8 days.   EPINEPHrine 0.3 mg/0.3 mL Soaj injection Commonly known as: EPI-PEN Inject into the muscle.   famotidine 20 MG tablet Commonly known as: PEPCID Take by mouth.   FISH OIL PO Take 1 capsule by mouth daily.   multivitamin with minerals Tabs tablet Take 1 tablet by mouth daily.   naproxen sodium 220 MG tablet Commonly known as: ALEVE Take 440 mg by mouth daily as needed (pain).   polyethylene glycol 17 g packet Commonly known as: MIRALAX / GLYCOLAX Take 17 g by mouth daily as needed for mild constipation (takes once daily every day and 2nd time only if needed).   valACYclovir 1000 MG tablet Commonly known as: VALTREX Take 1,000-2,000 mg by mouth See admin instructions.   zinc sulfate 220 (50 Zn) MG capsule Take 1 capsule (220 mg  total) by mouth daily.   zolpidem 5 MG tablet Commonly known as: AMBIEN Take 5 mg by mouth daily as needed for sleep.      Procedures/Studies: DG Chest Port 1 View  Result Date: 05/07/2020 CLINICAL DATA:  COVID, shortness of breath diagnosed with COVID on August 20th EXAM: PORTABLE CHEST 1 VIEW COMPARISON:  None FINDINGS: Signs of ACDF partially visualized at the upper margin of the image. Trachea is midline. Cardiomediastinal contours and hilar structures are normal. Peripheral consolidative changes worse in the mid and lower chest. No sign of pleural effusion. On limited assessment skeletal structures without acute process. IMPRESSION: Multifocal, peripheral and basilar predominant consolidative changes likely related to known COVID-19 infection/pneumonia. Electronically Signed   By: Zetta Bills M.D.   On: 05/07/2020 08:22      Subjective: Pt reports that she is feeling better today.  She has been ambulating in room.  She says she would like to go home.    Discharge Exam: Vitals:   05/09/20 0202 05/09/20 0657  BP: 124/89 109/70  Pulse: 65 68  Resp: 20 19  Temp: 98.4 F (36.9 C) 97.8 F (36.6 C)  SpO2: 92% 93%   Vitals:   05/08/20 2135 05/08/20 2204 05/09/20 0202 05/09/20 0657  BP:  121/68 124/89 109/70  Pulse:  85 65 68  Resp:  20 20 19   Temp:  99.1 F (37.3 C) 98.4 F (36.9 C) 97.8 F (36.6 C)  TempSrc:      SpO2: (!) 89% 95% 92% 93%  Weight:      Height:       General: Pt is alert, awake, not in acute distress Cardiovascular: normal S1/S2 +, no rubs, no gallops Respiratory: no increased work of breathing  Abdominal: Soft, NT, ND, bowel sounds + Extremities: no edema, no cyanosis   The results of significant diagnostics from this hospitalization (including imaging, microbiology, ancillary and laboratory) are listed below for reference.     Microbiology: Recent Results (from the past 240 hour(s))  SARS Coronavirus 2 by RT PCR (hospital order, performed in  [facility] hospital lab) Nasopharyngeal Nasopharyngeal Swab     Status: Abnormal   Collection Time: 05/07/20  9:15 AM   Specimen: Nasopharyngeal Swab  Result Value Ref Range Status  SARS Coronavirus 2 POSITIVE (A) NEGATIVE Final    Comment: RESULT CALLED TO, READ BACK BY AND VERIFIED WITH: HOLCOMB,R. AT 7673 ON 05/07/2020 BY BAUGHAM,M. (NOTE) SARS-CoV-2 target nucleic acids are DETECTED  SARS-CoV-2 RNA is generally detectable in upper respiratory specimens  during the acute phase of infection.  Positive results are indicative  of the presence of the identified virus, but do not rule out bacterial infection or co-infection with other pathogens not detected by the test.  Clinical correlation with patient history and  other diagnostic information is necessary to determine patient infection status.  The expected result is negative.  Fact Sheet for Patients:   StrictlyIdeas.no   Fact Sheet for Healthcare Providers:   BankingDealers.co.za    This test is not yet approved or cleared by the [place] FDA and  has been authorized for detection and/or diagnosis of SARS-CoV-2 by FDA under an Emergency Use Authorization (EUA).  This EUA will remain in effect (mea ning this test can be used) for the duration of  the COVID-19 declaration under Section 564(b)(1) of the Act, 21 U.S.C. section 360-bbb-3(b)(1), unless the authorization is terminated or revoked sooner.  Performed at [facility], [address] [place], [place] [address]   Blood Culture (routine x 2)     Status: None (Preliminary result)   Collection Time: 05/07/20  9:15 AM   Specimen: Left Antecubital; Blood  Result Value Ref Range Status   Specimen Description   Final    LEFT ANTECUBITAL BOTTLES DRAWN AEROBIC AND ANAEROBIC   Special Requests Blood Culture adequate volume  Final   Culture   Final    NO GROWTH < 24 HOURS Performed at [facility], [address],  [place], [place] [address]    Report Status PENDING  Incomplete  Blood Culture (routine x 2)     Status: None (Preliminary result)   Collection Time: 05/07/20 10:16 AM   Specimen: Right Antecubital; Blood  Result Value Ref Range Status   Specimen Description   Final    RIGHT ANTECUBITAL BOTTLES DRAWN AEROBIC AND ANAEROBIC   Special Requests Blood Culture adequate volume  Final   Culture   Final    NO GROWTH < 24 HOURS Performed at [facility], [address] [place], [place] [address]    Report Status PENDING  Incomplete    Labs: BNP (last 3 results) No results for input(s): BNP in the last 8760 hours. Basic Metabolic Panel: Recent Labs  Lab 05/07/20 0915 05/08/20 0645 05/09/20 0656  NA 136 141 143  K 3.1* 3.1* 3.7  CL 98 108 109  CO2 28 24 23   GLUCOSE 119* 165* 147*  BUN 11 13 15   CREATININE 0.52 0.50 0.50  CALCIUM 8.2* 8.2* 8.2*  MG  --   --  2.2   Liver Function Tests: Recent Labs  Lab 05/07/20 0915 05/08/20 0645 05/09/20 0656  AST 48* 32 31  ALT 27 23 25   ALKPHOS 45 39 36*  BILITOT 0.5 0.4 0.5  PROT 6.3* 5.9* 5.5*  ALBUMIN 3.2* 2.7* 2.6*   No results for input(s): LIPASE, AMYLASE in the last 168 hours. No results for input(s): AMMONIA in the last 168 hours. CBC: Recent Labs  Lab 05/07/20 0915 05/08/20 0645 05/09/20 0656  WBC 5.1 3.7* 9.0  NEUTROABS 3.8 3.0 7.8*  HGB 14.2 12.2 11.7*  HCT 40.0 35.4* 34.3*  MCV 92.8 95.4 95.8  PLT 151 163 204   Cardiac Enzymes: No results for input(s): CKTOTAL, CKMB, CKMBINDEX, TROPONINI  in the last 168 hours. BNP: Invalid input(s): POCBNP CBG: No results for input(s): GLUCAP in the last 168 hours. D-Dimer Recent Labs    05/08/20 0645 05/09/20 0656  DDIMER 0.85* 1.05*   Hgb A1c No results for input(s): HGBA1C in the last 72 hours. Lipid Profile Recent Labs    05/07/20 0915  TRIG 112   Thyroid function studies No results for input(s): TSH, T4TOTAL, T3FREE, THYROIDAB in the last 72 hours.  Invalid  input(s): FREET3 Anemia work up Recent Labs    05/08/20 0645 05/09/20 0656  FERRITIN 1,037* 951*   Urinalysis    Component Value Date/Time   COLORURINE YELLOW 05/07/2020 [place] 05/07/2020 1537   LABSPEC 1.014 05/07/2020 1537   PHURINE 6.0 05/07/2020 1537   GLUCOSEU NEGATIVE 05/07/2020 1537   HGBUR NEGATIVE 05/07/2020 1537   BILIRUBINUR NEGATIVE 05/07/2020 1537   KETONESUR NEGATIVE 05/07/2020 1537   PROTEINUR NEGATIVE 05/07/2020 1537   NITRITE NEGATIVE 05/07/2020 1537   LEUKOCYTESUR TRACE (A) 05/07/2020 1537   Sepsis Labs Invalid input(s): PROCALCITONIN,  WBC,  LACTICIDVEN Microbiology Recent Results (from the past 240 hour(s))  SARS Coronavirus 2 by RT PCR (hospital order, performed in [place] hospital lab) Nasopharyngeal Nasopharyngeal Swab     Status: Abnormal   Collection Time: 05/07/20  9:15 AM   Specimen: Nasopharyngeal Swab  Result Value Ref Range Status   SARS Coronavirus 2 POSITIVE (A) NEGATIVE Final    Comment: RESULT CALLED TO, READ BACK BY AND VERIFIED WITH: HOLCOMB,R. AT 1660 ON 05/07/2020 BY BAUGHAM,M. (NOTE) SARS-CoV-2 target nucleic acids are DETECTED  SARS-CoV-2 RNA is generally detectable in upper respiratory specimens  during the acute phase of infection.  Positive results are indicative  of the presence of the identified virus, but do not rule out bacterial infection or co-infection with other pathogens not detected by the test.  Clinical correlation with patient history and  other diagnostic information is necessary to determine patient infection status.  The expected result is negative.  Fact Sheet for Patients:   StrictlyIdeas.no   Fact Sheet for Healthcare Providers:   BankingDealers.co.za    This test is not yet approved or cleared by the [place] FDA and  has been authorized for detection and/or diagnosis of SARS-CoV-2 by FDA under an Emergency Use Authorization  (EUA).  This EUA will remain in effect (mea ning this test can be used) for the duration of  the COVID-19 declaration under Section 564(b)(1) of the Act, 21 U.S.C. section 360-bbb-3(b)(1), unless the authorization is terminated or revoked sooner.  Performed at [facility], [address], [place], [place] [address]   Blood Culture (routine x 2)     Status: None (Preliminary result)   Collection Time: 05/07/20  9:15 AM   Specimen: Left Antecubital; Blood  Result Value Ref Range Status   Specimen Description   Final    LEFT ANTECUBITAL BOTTLES DRAWN AEROBIC AND ANAEROBIC   Special Requests Blood Culture adequate volume  Final   Culture   Final    NO GROWTH < 24 HOURS Performed at [facility], [address] [place], [place] [address]    Report Status PENDING  Incomplete  Blood Culture (routine x 2)     Status: None (Preliminary result)   Collection Time: 05/07/20 10:16 AM   Specimen: Right Antecubital; Blood  Result Value Ref Range Status   Specimen Description   Final    RIGHT ANTECUBITAL BOTTLES DRAWN AEROBIC AND ANAEROBIC   Special Requests Blood  Culture adequate volume  Final   Culture   Final    NO GROWTH < 24 HOURS Performed at [facility], [address], [place], [place] [address]    Report Status PENDING  Incomplete   Time coordinating discharge: 33 minutes   SIGNED:  Irwin Brakeman, MD  [facility] 05/09/2020, 10:20 AM How to contact the [facility] Attending or Consulting provider [place] or covering provider during after hours [place], for this patient?  1. Check the care team in [facility] and look for a) attending/consulting TRH provider listed and b) the [facility] team listed 2. Log into [email or web address] and use [place]'s universal password to access. If you do not have the password, please contact the hospital operator. 3. Locate the [facility] provider you are looking for under [facility] and page to a number that you can be directly reached. 4. If you still have  difficulty reaching the provider, please page the [facility] (Director on Call) for the Hospitalists listed on amion for assistance.

## 2020-05-09 NOTE — Progress Notes (Signed)
Patient scheduled for outpatient Remdesivir infusions at 11am on Wednesday 9/2 and Thursday 9/3 at [facility]. Please inform the patient to park at [place], as staff will be escorting the patient through the [place] entrance of the hospital.    There is a wave flag banner located near the entrance on [address]. Turn into this entrance and immediately turn left and park in 1 of the 5 designated Covid Infusion Parking spots. There is a phone number on the sign, please call and let the staff know what spot you are in and we will come out and get you. For questions call [phone].  Thanks.

## 2020-05-09 NOTE — TOC Initial Note (Addendum)
Transition of Care [facility]) - Initial/Assessment Note    Patient Details  Name: Stacey Rowe MRN: [id number] Date of Birth: 1961-04-19  Transition of Care [facility]) CM/SW Contact:    Ihor Gully, LCSW Phone Number: 05/09/2020, 12:14 PM  Clinical Narrative:                 Patient admitted with COVID. Discharging with home oxygen needs. Patient agreeable to oxygen. Oxygen ordered through [employer].  Spoke with Marta Antu at [facility] transportation regarding scheduling transport to oppt remdesivir on Thursday and Friday. Marta Antu is to call LCSW back to confirm transportation can be arranged for Thursday and Friday.  Marta Antu returned contact confirming that transportation for oppt remdesivir has been scheduled.   Expected Discharge Plan: Home/Self Care Barriers to Discharge: Other (comment) (awaiting to see if [facility] transportation can take patient to oppt remdesiver appointments Thursday and Friday.)   Patient Goals and CMS Choice Patient states their goals for this hospitalization and ongoing recovery are:: return home   Choice offered to / list presented to : Patient  Expected Discharge Plan and Services Expected Discharge Plan: Home/Self Care     Post Acute Care Choice: Durable Medical Equipment Living arrangements for the past 2 months: Single Family Home Expected Discharge Date: 05/09/20               DME Arranged: Oxygen   Date DME Agency Contacted: 05/09/20 Time DME Agency Contacted: 59 Representative spoke with at DME Agency: Barbaraann Rondo            Prior Living Arrangements/Services Living arrangements for the past 2 months: [place] with:: Self Patient language and need for interpreter reviewed:: Yes Do you feel safe going back to the place where you live?: Yes      Need for Family Participation in Patient Care: Yes (Comment) Care giver support system in place?: Yes (comment)   Criminal Activity/Legal Involvement Pertinent to Current  Situation/Hospitalization: No - Comment as needed  Activities of Daily Living Home Assistive Devices/Equipment: None ADL Screening (condition at time of admission) Patient's cognitive ability adequate to safely complete daily activities?: Yes Is the patient deaf or have difficulty hearing?: No Does the patient have difficulty seeing, even when wearing glasses/contacts?: No Does the patient have difficulty concentrating, remembering, or making decisions?: No Patient able to express need for assistance with ADLs?: No Does the patient have difficulty dressing or bathing?: No Independently performs ADLs?: Yes (appropriate for developmental age) Does the patient have difficulty walking or climbing stairs?: No Weakness of Legs: None Weakness of Arms/Hands: None  Permission Sought/Granted Permission sought to share information with : Other (comment)    Share Information with NAME: Marta Antu, [facility] transportation           Emotional Assessment Appearance:: Appears younger than stated age   Affect (typically observed): Appropriate Orientation: : Oriented to Self, Oriented to Place, Oriented to  Time, Oriented to Situation Alcohol / Substance Use: Not Applicable Psych Involvement: No (comment)  Admission diagnosis:  Acute respiratory failure with hypoxia ([place]) [J96.01] Acute respiratory failure due to COVID-19 ([place]) [U07.1, J96.00] COVID-19 [U07.1] Patient Active Problem List   Diagnosis Date Noted  . Acute respiratory failure due to COVID-19 ([place]) 05/07/2020  . Acute respiratory failure with hypoxia ([place])   . Lateral epicondylitis of right elbow 07/27/2013   PCP:  Kathyrn Drown, MD Pharmacy:   [id number] - [place], [place] [address] Phone:  [phone] Fax: [phone]     Social Determinants of Health (SDOH) Interventions    Readmission Risk Interventions No flowsheet data found.

## 2020-05-09 NOTE — Progress Notes (Signed)
Nsg Discharge Note  Admit Date:  05/07/2020 Discharge date: 05/09/2020   Toya Smothers to be D/C'd Home per MD order.  AVS completed.  Copy for chart, and copy for patient signed, and dated. Reviewed d/c paperwork with patient. Gave preprinted prescriptions. Answered all questions. Wheeled stable patient and belongings to ED entrance where she was picked up by her friend to d/c to home. Patient/caregiver able to verbalize understanding.  Discharge Medication: Allergies as of 05/09/2020      Reactions   Hydrocodone Itching   Mucinex Dm Maximum [dm-guaifenesin Er] Nausea And Vomiting      Medication List    STOP taking these medications   predniSONE 20 MG tablet Commonly known as: DELTASONE     TAKE these medications   acetaminophen 325 MG tablet Commonly known as: TYLENOL Take 2 tablets (650 mg total) by mouth every 6 (six) hours as needed for mild pain or headache (fever >/= 101).   albuterol 108 (90 Base) MCG/ACT inhaler Commonly known as: ProAir HFA Inhale 2 puffs into the lungs every 4 (four) hours as needed for wheezing or shortness of breath. What changed:   how much to take  how to take this  when to take this  reasons to take this  additional instructions   ascorbic acid 500 MG tablet Commonly known as: VITAMIN C Take 1 tablet (500 mg total) by mouth daily.   B-12 5000 MCG Caps Take 1 tablet by mouth daily.   benzonatate 100 MG capsule Commonly known as: TESSALON Take 1 capsule (100 mg total) by mouth 3 (three) times daily as needed for cough.   cholecalciferol 1000 units tablet Commonly known as: VITAMIN D Take 1,000 Units by mouth daily.   dexamethasone 6 MG tablet Commonly known as: DECADRON Take 1 tablet (6 mg total) by mouth daily for 8 days.   EPINEPHrine 0.3 mg/0.3 mL Soaj injection Commonly known as: EPI-PEN Inject into the muscle.   famotidine 20 MG tablet Commonly known as: PEPCID Take by mouth.   FISH OIL PO Take 1 capsule by mouth  daily.   multivitamin with minerals Tabs tablet Take 1 tablet by mouth daily.   naproxen sodium 220 MG tablet Commonly known as: ALEVE Take 440 mg by mouth daily as needed (pain).   polyethylene glycol 17 g packet Commonly known as: MIRALAX / GLYCOLAX Take 17 g by mouth daily as needed for mild constipation (takes once daily every day and 2nd time only if needed).   valACYclovir 1000 MG tablet Commonly known as: VALTREX Take 1,000-2,000 mg by mouth See admin instructions.   zinc sulfate 220 (50 Zn) MG capsule Take 1 capsule (220 mg total) by mouth daily.   zolpidem 5 MG tablet Commonly known as: AMBIEN Take 5 mg by mouth daily as needed for sleep.            Durable Medical Equipment  (From admission, onward)         Start     Ordered   05/09/20 1035  For home use only DME oxygen  Once       Question Answer Comment  Length of Need 6 Months   Mode or (Route) Nasal cannula   Liters per Minute 4   Frequency Continuous (stationary and portable oxygen unit needed)   Oxygen conserving device Yes   Oxygen delivery system Gas      05/09/20 1034          Discharge Assessment: Vitals:   05/09/20  9038 05/09/20 1359  BP: 109/70 110/74  Pulse: 68 70  Resp: 19 (!) 23  Temp: 97.8 F (36.6 C) 97.9 F (36.6 C)  SpO2: 93% 91%   Skin clean, dry and intact without evidence of skin break down, no evidence of skin tears noted. IV catheter discontinued intact. Site without signs and symptoms of complications - no redness or edema noted at insertion site, patient denies c/o pain - only slight tenderness at site.  Dressing with slight pressure applied.  D/c Instructions-Education: Discharge instructions given to patient/family with verbalized understanding. D/c education completed with patient/family including follow up instructions, medication list, d/c activities limitations if indicated, with other d/c instructions as indicated by MD - patient able to verbalize  understanding, all questions fully answered. Patient instructed to return to ED, call 911, or call MD for any changes in condition.  Patient escorted via [place], and D/C home via private auto.  Santa Lighter, RN 05/09/2020 8:24 PM

## 2020-05-09 NOTE — Progress Notes (Addendum)
Discussed with patient discharge instructions, she verbalized agreement and understanding.

## 2020-05-09 NOTE — Progress Notes (Signed)
SATURATION QUALIFICATIONS: (This note is used to comply with regulatory documentation for home oxygen)  Patient Saturations on Room Air at Rest = 88%  Patient Saturations on Room Air while Ambulating = 80%  Patient Saturations on 4 Liters of oxygen while Ambulating = 87%  Please briefly explain why patient needs home oxygen: Pt became very Short of breath with minimal ambulation. Needed to sit down before making it to the bed to rest. O2 sats slow to rise ( approx 2 min )

## 2020-05-09 NOTE — Discharge Instructions (Signed)
Patient scheduled for outpatient Remdesivir infusions at 11am on Wednesday 9/2 and Thursday 9/3 at [facility]. Please inform the patient to park at [place], as staff will be escorting the patient through the [place] entrance of the hospital. Appointment will take approximately 45 minutes.   There is a wave flag banner located near the entrance on [address]. Turn into this entrance and immediately turn left and park in 1 of the 5 designated Covid Infusion Parking spots. There is a phone number on the sign, please call and let the staff know what spot you are in and we will come out and get you. For questions call [phone].  Thanks.  10 Things You Can Do to Manage Your COVID-19 Symptoms at Home If you have possible or confirmed COVID-19: 1. Stay home from work and school. And stay away from other public places. If you must go out, avoid using any kind of public transportation, ridesharing, or taxis. 2. Monitor your symptoms carefully. If your symptoms get worse, call your healthcare provider immediately. 3. Get rest and stay hydrated. 4. If you have a medical appointment, call the healthcare provider ahead of time and tell them that you have or may have COVID-19. 5. For medical emergencies, call 911 and notify the dispatch personnel that you have or may have COVID-19. 6. Cover your cough and sneezes with a tissue or use the inside of your elbow. 7. Wash your hands often with soap and water for at least 20 seconds or clean your hands with an alcohol-based hand sanitizer that contains at least 60% alcohol. 8. As much as possible, stay in a specific room and away from other people in your home. Also, you should use a separate bathroom, if available. If you need to be around other people in or outside of the home, wear a mask. 9. Avoid sharing personal items with other people in your household, like dishes, towels, and bedding. 10. Clean all surfaces that are touched often,  like counters, tabletops, and doorknobs. Use household cleaning sprays or wipes according to the label instructions. [email or web address] 03/09/2019 This information is not intended to replace advice given to you by your health care provider. Make sure you discuss any questions you have with your health care provider. Document Revised: 08/11/2019 Document Reviewed: 08/11/2019 Elsevier Patient Education  [place].   COVID-19 Frequently Asked Questions COVID-19 (coronavirus disease) is an infection that is caused by a large family of viruses. Some viruses cause illness in people and others cause illness in animals like camels, cats, and bats. In some cases, the viruses that cause illness in animals can spread to humans. Where did the coronavirus come from? In December 2019, [place] told the [facility]) of several cases of lung disease (human respiratory illness). These cases were linked to an open seafood and livestock market in the city of [place]. The link to the seafood and livestock market suggests that the virus may have spread from animals to humans. However, since that first outbreak in December, the virus has also been shown to spread from person to person. What is the name of the disease and the virus? Disease name Early on, this disease was called novel coronavirus. This is because scientists determined that the disease was caused by a new (novel) respiratory virus. The World Health Organization [facility]) has now named the disease COVID-19, or coronavirus disease. Virus name The virus that causes the disease is called severe acute respiratory  syndrome coronavirus 2 (SARS-CoV-2). More information on disease and virus naming World Health Organization [facility]): www.who.int/emergencies/diseases/novel-coronavirus-2019/technical-guidance/naming-the-coronavirus-disease-(covid-2019)-and-the-virus-that-causes-it Who is at risk for complications from coronavirus disease? Some  people may be at higher risk for complications from coronavirus disease. This includes older adults and people who have chronic diseases, such as heart disease, diabetes, and lung disease. If you are at higher risk for complications, take these extra precautions:  Stay home as much as possible.  Avoid social gatherings and travel.  Avoid close contact with others. Stay at least 6 ft (2 m) away from others, if possible.  Wash your hands often with soap and water for at least 20 seconds.  Avoid touching your face, mouth, nose, or eyes.  Keep supplies on hand at home, such as food, medicine, and cleaning supplies.  If you must go out in public, wear a cloth face covering or face mask. Make sure your mask covers your nose and mouth. How does coronavirus disease spread? The virus that causes coronavirus disease spreads easily from person to person (is contagious). You may catch the virus by:  Breathing in droplets from an infected person. Droplets can be spread by a person breathing, speaking, singing, coughing, or sneezing.  Touching something, like a table or a doorknob, that was exposed to the virus (contaminated) and then touching your mouth, nose, or eyes. Can I get the virus from touching surfaces or objects? There is still a lot that we do not know about the virus that causes coronavirus disease. Scientists are basing a lot of information on what they know about similar viruses, such as:  Viruses cannot generally survive on surfaces for long. They need a human body (host) to survive.  It is more likely that the virus is spread by close contact with people who are sick (direct contact), such as through: ? Shaking hands or hugging. ? Breathing in respiratory droplets that travel through the air. Droplets can be spread by a person breathing, speaking, singing, coughing, or sneezing.  It is less likely that the virus is spread when a person touches a surface or object that has the virus  on it (indirect contact). The virus may be able to enter the body if the person touches a surface or object and then touches his or her face, eyes, nose, or mouth. Can a person spread the virus without having symptoms of the disease? It may be possible for the virus to spread before a person has symptoms of the disease, but this is most likely not the main way the virus is spreading. It is more likely for the virus to spread by being in close contact with people who are sick and breathing in the respiratory droplets spread by a person breathing, speaking, singing, coughing, or sneezing. What are the symptoms of coronavirus disease? Symptoms vary from person to person and can range from mild to severe. Symptoms may include:  Fever or chills.  Cough.  Difficulty breathing or feeling short of breath.  Headaches, body aches, or muscle aches.  Runny or stuffy (congested) nose.  Sore throat.  New loss of taste or smell.  Nausea, vomiting, or diarrhea. These symptoms can appear anywhere from 2 to 14 days after you have been exposed to the virus. Some people may not have any symptoms. If you develop symptoms, call your health care provider. People with severe symptoms may need hospital care. Should I be tested for this virus? Your health care provider will decide whether to test you based on  your symptoms, history of exposure, and your risk factors. How does a health care provider test for this virus? Health care providers will collect samples to send for testing. Samples may include:  Taking a swab of fluid from the back of your nose and throat, your nose, or your throat.  Taking fluid from the lungs by having you cough up mucus (sputum) into a sterile cup.  Taking a blood sample. Is there a treatment or vaccine for this virus? Currently, there is no vaccine to prevent coronavirus disease. Also, there are no medicines like antibiotics or antivirals to treat the virus. A person who becomes  sick is given supportive care, which means rest and fluids. A person may also relieve his or her symptoms by using over-the-counter medicines that treat sneezing, coughing, and runny nose. These are the same medicines that a person takes for the common cold. If you develop symptoms, call your health care provider. People with severe symptoms may need hospital care. What can I do to protect myself and my family from this virus?     You can protect yourself and your family by taking the same actions that you would take to prevent the spread of other viruses. Take the following actions:  Wash your hands often with soap and water for at least 20 seconds. If soap and water are not available, use alcohol-based hand sanitizer.  Avoid touching your face, mouth, nose, or eyes.  Cough or sneeze into a tissue, sleeve, or elbow. Do not cough or sneeze into your hand or the air. ? If you cough or sneeze into a tissue, throw it away immediately and wash your hands.  Disinfect objects and surfaces that you frequently touch every day.  Stay away from people who are sick.  Avoid going out in public, follow guidance from your state and local health authorities.  Avoid crowded indoor spaces. Stay at least 6 ft (2 m) away from others.  If you must go out in public, wear a cloth face covering or face mask. Make sure your mask covers your nose and mouth.  Stay home if you are sick, except to get medical care. Call your health care provider before you get medical care. Your health care provider will tell you how long to stay home.  Make sure your vaccines are up to date. Ask your health care provider what vaccines you need. What should I do if I need to travel? Follow travel recommendations from your local health authority, the CDC, and WHO. Travel information and advice  Centers for Disease Control and Prevention (CDC): BodyEditor.hu  World Health Organization  [facility]): ThirdIncome.ca Know the risks and take action to protect your health  You are at higher risk of getting coronavirus disease if you are traveling to areas with an outbreak or if you are exposed to travelers from areas with an outbreak.  Wash your hands often and practice good hygiene to lower the risk of catching or spreading the virus. What should I do if I am sick? General instructions to stop the spread of infection  Wash your hands often with soap and water for at least 20 seconds. If soap and water are not available, use alcohol-based hand sanitizer.  Cough or sneeze into a tissue, sleeve, or elbow. Do not cough or sneeze into your hand or the air.  If you cough or sneeze into a tissue, throw it away immediately and wash your hands.  Stay home unless you must get medical care.  Call your health care provider or local health authority before you get medical care.  Avoid public areas. Do not take public transportation, if possible.  If you can, wear a mask if you must go out of the house or if you are in close contact with someone who is not sick. Make sure your mask covers your nose and mouth. Keep your home clean  Disinfect objects and surfaces that are frequently touched every day. This may include: ? Counters and tables. ? Doorknobs and light switches. ? Sinks and faucets. ? Electronics such as phones, remote controls, keyboards, computers, and tablets.  Wash dishes in hot, soapy water or use a dishwasher. Air-dry your dishes.  Wash laundry in hot water. Prevent infecting other household members  Let healthy household members care for children and pets, if possible. If you have to care for children or pets, wash your hands often and wear a mask.  Sleep in a different bedroom or bed, if possible.  Do not share personal items, such as razors, toothbrushes, deodorant, combs, brushes, towels, and washcloths. Where to  find more information Centers for Disease Control and Prevention (CDC)  Information and news updates: https://www.butler-gonzalez.com/ World Health Organization [facility])  Information and news updates: MissExecutive.com.ee  Coronavirus health topic: https://www.castaneda.info/  Questions and answers on COVID-19: OpportunityDebt.at  Global tracker: who.sprinklr.com American Academy of Pediatrics (AAP)  Information for families: www.healthychildren.org/English/health-issues/conditions/chest-lungs/Pages/2019-Novel-Coronavirus.aspx The coronavirus situation is changing rapidly. Check your local health authority website or the CDC and [facility] websites for updates and news. When should I contact a health care provider?  Contact your health care provider if you have symptoms of an infection, such as fever or cough, and you: ? Have been near anyone who is known to have coronavirus disease. ? Have come into contact with a person who is suspected to have coronavirus disease. ? Have traveled to an area where there is an outbreak of COVID-19. When should I get emergency medical care?  Get help right away by calling your local emergency services (911 in the [place] if you have: ? Trouble breathing. ? Pain or pressure in your chest. ? Confusion. ? Blue-tinged lips and fingernails. ? Difficulty waking from sleep. ? Symptoms that get worse. Let the emergency medical personnel know if you think you have coronavirus disease. Summary  A new respiratory virus is spreading from person to person and causing COVID-19 (coronavirus disease).  The virus that causes COVID-19 appears to spread easily. It spreads from one person to another through droplets from breathing, speaking, singing, coughing, or sneezing.  Older adults and those with chronic diseases are at higher risk of disease. If you are at higher risk for complications, take  extra precautions.  There is currently no vaccine to prevent coronavirus disease. There are no medicines, such as antibiotics or antivirals, to treat the virus.  You can protect yourself and your family by washing your hands often, avoiding touching your face, and covering your coughs and sneezes. This information is not intended to replace advice given to you by your health care provider. Make sure you discuss any questions you have with your health care provider. Document Revised: 06/24/2019 Document Reviewed: 12/21/2018 Elsevier Patient Education  [place] if You Are Sick If you are sick with COVID-19 or think you might have COVID-19, follow the steps below to care for yourself and to help protect other people in your home and community. Stay home except to get medical care.  Stay home. Most people with COVID-19 have mild illness and are able to recover at home without medical care. Do not leave your home, except to get medical care. Do not visit public areas.  Take care of yourself. Get rest and stay hydrated. Take over-the-counter medicines, such as acetaminophen, to help you feel better.  Stay in touch with your doctor. Call before you get medical care. Be sure to get care if you have trouble breathing, or have any other emergency warning signs, or if you think it is an emergency.  Avoid public transportation, ride-sharing, or taxis. Separate yourself from other people and pets in your home.  As much as possible, stay in a specific room and away from other people and pets in your home. Also, you should use a separate bathroom, if available. If you need to be around other people or animals in or outside of the home, wear a mask. ? See COVID-19 and Animals if you have questions about USFirm.ch. ? Additional guidance is available for those living in close quarters.  (http://www.turner-rogers.com/.html) and shared housing (TVStereos.ch). Monitor your symptoms.  Symptoms of COVID-19 include fever, cough, and shortness of breath but other symptoms may be present as well.  Follow care instructions from your healthcare provider and local health department. Your local health authorities will give instructions on checking your symptoms and reporting information. When to Seek Emergency Medical Attention Look for emergency warning signs* for COVID-19. If someone is showing any of these signs, seek emergency medical care immediately:  Trouble breathing  Persistent pain or pressure in the chest  New confusion  Bluish lips or face  Inability to wake or stay awake *This list is not all possible symptoms. Please call your medical provider for any other symptoms that are severe or concerning to you. Call 911 or call ahead to your local emergency facility: Notify the operator that you are seeking care for someone who has or may have COVID-19. Call ahead before visiting your doctor.  Call ahead. Many medical visits for routine care are being postponed or done by phone or telemedicine.  If you have a medical appointment that cannot be postponed, call your doctor's office, and tell them you have or may have COVID-19. If you are sick, wear a mask over your nose and mouth.  You should wear a mask over your nose and mouth if you must be around other people or animals, including pets (even at home).  You don't need to wear the mask if you are alone. If you can't put on a mask (because of trouble breathing for example), cover your coughs and sneezes in some other way. Try to stay at least 6 feet away from other people. This will help protect the people around you.  Masks should not be placed on young children under age 9 years, anyone who has  trouble breathing, or anyone who is not able to remove the mask without help. Note: During the COVID-19 pandemic, medical grade facemasks are reserved for healthcare workers and some first responders. You may need to make a mask using a scarf or bandana. Cover your coughs and sneezes.  Cover your mouth and nose with a tissue when you cough or sneeze.  Throw used tissues in a lined trash can.  Immediately wash your hands with soap and water for at least 20 seconds. If soap and water are not available, clean your hands with an alcohol-based hand sanitizer that contains at least 60% alcohol. Clean your hands often.  Wash your hands often with soap and water for at least 20 seconds. This is especially important after blowing your nose, coughing, or sneezing; going to the bathroom; and before eating or preparing food.  Use hand sanitizer if soap and water are not available. Use an alcohol-based hand sanitizer with at least 60% alcohol, covering all surfaces of your hands and rubbing them together until they feel dry.  Soap and water are the best option, especially if your hands are visibly dirty.  Avoid touching your eyes, nose, and mouth with unwashed hands. Avoid sharing personal household items.  Do not share dishes, drinking glasses, cups, eating utensils, towels, or bedding with other people in your home.  Wash these items thoroughly after using them with soap and water or put them in the dishwasher. Clean all "high-touch" surfaces everyday.  Clean and disinfect high-touch surfaces in your "sick room" and bathroom. Let someone else clean and disinfect surfaces in common areas, but not your bedroom and bathroom.  If a caregiver or other person needs to clean and disinfect a sick person's bedroom or bathroom, they should do so on an as-needed basis. The caregiver/other person should wear a mask and wait as long as possible after the sick person has used the bathroom. High-touch surfaces  include phones, remote controls, counters, tabletops, doorknobs, bathroom fixtures, toilets, keyboards, tablets, and bedside tables.  Clean and disinfect areas that may have blood, stool, or body fluids on them.  Use household cleaners and disinfectants. Clean the area or item with soap and water or another detergent if it is dirty. Then use a household disinfectant. ? Be sure to follow the instructions on the label to ensure safe and effective use of the product. Many products recommend keeping the surface wet for several minutes to ensure germs are killed. Many also recommend precautions such as wearing gloves and making sure you have good ventilation during use of the product. ? Most EPA-registered household disinfectants should be effective. When you can be around others after you had or likely had COVID-19 When you can be around others (end home isolation) depends on different factors for different situations.  I think or know I had COVID-19, and I had symptoms ? You can be with others after  24 hours with no fever AND  Symptoms improved AND  10 days since symptoms first appeared ? Depending on your healthcare provider's advice and availability of testing, you might get tested to see if you still have COVID-19. If you will be tested, you can be around others when you have no fever, symptoms have improved, and you receive two negative test results in a row, at least 24 hours apart.  I tested positive for COVID-19 but had no symptoms ? If you continue to have no symptoms, you can be with others after:  10 days have passed since test ? Depending on your healthcare provider's advice and availability of testing, you might get tested to see if you still have COVID-19. If you will be tested, you can be around others after you receive two negative test results in a row, at least 24 hours apart. ? If you develop symptoms after testing positive, follow the guidance above for "I think or know I  had COVID, and I had symptoms." [email or web address] 04/19/2019 This information is not intended to replace advice given to you by your health care provider. Make sure you discuss any questions you have with your health care provider. Document Revised: 05/05/2019 Document Reviewed: 03/08/2019  Elsevier Patient Education  [place]: Quarantine vs. Isolation [place] keeps someone who was in close contact with someone who has COVID-19 away from others. If you had close contact with a person who has COVID-19  Stay home until 14 days after your last contact.  Check your temperature twice a day and watch for symptoms of COVID-19.  If possible, stay away from people who are at higher-risk for getting very sick from COVID-19. ISOLATION keeps someone who is sick or tested positive for COVID-19 without symptoms away from others, even in their own home. If you are sick and think or know you have COVID-19  Stay home until after ? At least 10 days since symptoms first appeared and ? At least 24 hours with no fever without fever-reducing medication and ? Symptoms have improved If you tested positive for COVID-19 but do not have symptoms  Stay home until after ? 10 days have passed since your positive test If you live with others, stay in a specific "sick room" or area and away from other people or animals, including pets. Use a separate bathroom, if available. [email or web address] 03/28/2019 This information is not intended to replace advice given to you by your health care provider. Make sure you discuss any questions you have with your health care provider. Document Revised: 08/11/2019 Document Reviewed: 08/11/2019 Elsevier Patient Education  [place].    IMPORTANT INFORMATION: PAY CLOSE ATTENTION   PHYSICIAN DISCHARGE INSTRUCTIONS  Follow with Primary care provider  Kathyrn Drown, MD  and other consultants as instructed by your Hospitalist  Physician  [place] IF SYMPTOMS COME BACK, WORSEN OR NEW PROBLEM DEVELOPS   Please note: You were cared for by a hospitalist during your hospital stay. Every effort will be made to forward records to your primary care provider.  You can request that your primary care provider send for your hospital records if they have not received them.  Once you are discharged, your primary care physician will handle any further medical issues. Please note that NO REFILLS for any discharge medications will be authorized once you are discharged, as it is imperative that you return to your primary care physician (or establish a relationship with a primary care physician if you do not have one) for your post hospital discharge needs so that they can reassess your need for medications and monitor your lab values.  Please get a complete blood count and chemistry panel checked by your Primary MD at your next visit, and again as instructed by your Primary MD.  Get Medicines reviewed and adjusted: Please take all your medications with you for your next visit with your Primary MD  Laboratory/radiological data: Please request your Primary MD to go over all hospital tests and procedure/radiological results at the follow up, please ask your primary care provider to get all Hospital records sent to his/her office.  In some cases, they will be blood work, cultures and biopsy results pending at the time of your discharge. Please request that your primary care provider follow up on these results.  If you are diabetic, please bring your blood sugar readings with you to your follow up appointment with primary care.    Please call and make your follow up appointments as soon as possible.    Also Note the following: If you experience worsening of your admission symptoms, develop shortness of breath, life threatening emergency, suicidal or homicidal thoughts you must seek medical  attention  immediately by calling 911 or calling your MD immediately  if symptoms less severe.  You must read complete instructions/literature along with all the possible adverse reactions/side effects for all the Medicines you take and that have been prescribed to you. Take any new Medicines after you have completely understood and accpet all the possible adverse reactions/side effects.   Do not drive when taking Pain medications or sleeping medications (Benzodiazepines)  Do not take more than prescribed Pain, Sleep and Anxiety Medications. It is not advisable to combine anxiety,sleep and pain medications without talking with your primary care practitioner  Special Instructions: If you have smoked or chewed Tobacco  in the last 2 yrs please stop smoking, stop any regular Alcohol  and or any Recreational drug use.  Wear Seat belts while driving.  Do not drive if taking any narcotic, mind altering or controlled substances or recreational drugs or alcohol.

## 2020-05-10 ENCOUNTER — Ambulatory Visit (HOSPITAL_COMMUNITY)
Admit: 2020-05-10 | Discharge: 2020-05-10 | Disposition: A | Discharge status: Home / Self Care | Payer: BC Managed Care – PPO | Attending: Pulmonary Disease | Admitting: Pulmonary Disease

## 2020-05-10 DIAGNOSIS — U071 COVID-19: Secondary | ICD-10-CM | POA: Insufficient documentation

## 2020-05-10 DIAGNOSIS — J1282 Pneumonia due to coronavirus disease 2019: Secondary | ICD-10-CM | POA: Diagnosis not present

## 2020-05-10 MED ORDER — FAMOTIDINE IN NACL 20-0.9 MG/50ML-% IV SOLN: 20.0000 mg | Freq: Once | INTRAVENOUS | Status: DC | PRN

## 2020-05-10 MED ORDER — SODIUM CHLORIDE 0.9 % IV SOLN: INTRAVENOUS | Status: DC | PRN

## 2020-05-10 MED ORDER — ALBUTEROL SULFATE HFA 108 (90 BASE) MCG/ACT IN AERS: 2.0000 | INHALATION_SPRAY | Freq: Once | RESPIRATORY_TRACT | Status: DC | PRN

## 2020-05-10 MED ORDER — EPINEPHRINE 0.3 MG/0.3ML IJ SOAJ: 0.3000 mg | Freq: Once | INTRAMUSCULAR | Status: DC | PRN

## 2020-05-10 MED ORDER — SODIUM CHLORIDE 0.9 % IV SOLN
100.0000 mg | Freq: Once | INTRAVENOUS | Status: AC
  Administered 2020-05-10: 100 mg via INTRAVENOUS
  Filled 2020-05-10: qty 20

## 2020-05-10 MED ORDER — METHYLPREDNISOLONE SODIUM SUCC 125 MG IJ SOLR: 125.0000 mg | Freq: Once | INTRAMUSCULAR | Status: DC | PRN

## 2020-05-10 MED ORDER — DIPHENHYDRAMINE HCL 50 MG/ML IJ SOLN: 50.0000 mg | Freq: Once | INTRAMUSCULAR | Status: DC | PRN

## 2020-05-10 NOTE — Progress Notes (Signed)
  Diagnosis: COVID-19  Physician: Dr. Joya Gaskins  Procedure: [facility]fusion [facility] Med: remdesivir infusion - Provided patient with remdesivir fact sheet for patients, parents and caregivers prior to infusion.  Complications: No immediate complications noted.  Discharge: Discharged home   [name] 05/10/2020

## 2020-05-10 NOTE — Discharge Instructions (Signed)
10 Things You Can Do to Manage Your COVID-19 Symptoms at Home If you have possible or confirmed COVID-19: 1. Stay home from work and school. And stay away from other public places. If you must go out, avoid using any kind of public transportation, ridesharing, or taxis. 2. Monitor your symptoms carefully. If your symptoms get worse, call your healthcare provider immediately. 3. Get rest and stay hydrated. 4. If you have a medical appointment, call the healthcare provider ahead of time and tell them that you have or may have COVID-19. 5. For medical emergencies, call 911 and notify the dispatch personnel that you have or may have COVID-19. 6. Cover your cough and sneezes with a tissue or use the inside of your elbow. 7. Wash your hands often with soap and water for at least 20 seconds or clean your hands with an alcohol-based hand sanitizer that contains at least 60% alcohol. 8. As much as possible, stay in a specific room and away from other people in your home. Also, you should use a separate bathroom, if available. If you need to be around other people in or outside of the home, wear a mask. 9. Avoid sharing personal items with other people in your household, like dishes, towels, and bedding. 10. Clean all surfaces that are touched often, like counters, tabletops, and doorknobs. Use household cleaning sprays or wipes according to the label instructions. cdc.gov/coronavirus 03/09/2019 This information is not intended to replace advice given to you by your health care provider. Make sure you discuss any questions you have with your health care provider. Document Revised: 08/11/2019 Document Reviewed: 08/11/2019 Elsevier Patient Education  2020 Elsevier Inc.  

## 2020-05-11 ENCOUNTER — Ambulatory Visit (HOSPITAL_COMMUNITY)
Admission: RE | Admit: 2020-05-11 | Discharge: 2020-05-11 | Disposition: A | Discharge status: Home / Self Care | Payer: BC Managed Care – PPO | Source: Ambulatory Visit | Attending: Pulmonary Disease | Admitting: Pulmonary Disease

## 2020-05-11 ENCOUNTER — Telehealth: Payer: Self-pay | Admitting: Family Medicine

## 2020-05-11 DIAGNOSIS — J1289 Other viral pneumonia: Secondary | ICD-10-CM | POA: Insufficient documentation

## 2020-05-11 DIAGNOSIS — U071 COVID-19: Secondary | ICD-10-CM | POA: Insufficient documentation

## 2020-05-11 MED ORDER — EPINEPHRINE 0.3 MG/0.3ML IJ SOAJ: 0.3000 mg | Freq: Once | INTRAMUSCULAR | Status: DC | PRN

## 2020-05-11 MED ORDER — ALBUTEROL SULFATE HFA 108 (90 BASE) MCG/ACT IN AERS: 2.0000 | INHALATION_SPRAY | Freq: Once | RESPIRATORY_TRACT | Status: DC | PRN

## 2020-05-11 MED ORDER — SODIUM CHLORIDE 0.9 % IV SOLN
100.0000 mg | Freq: Once | INTRAVENOUS | Status: AC
  Administered 2020-05-11: 100 mg via INTRAVENOUS

## 2020-05-11 MED ORDER — METHYLPREDNISOLONE SODIUM SUCC 125 MG IJ SOLR: 125.0000 mg | Freq: Once | INTRAMUSCULAR | Status: DC | PRN

## 2020-05-11 MED ORDER — SODIUM CHLORIDE 0.9 % IV SOLN: INTRAVENOUS | Status: DC | PRN

## 2020-05-11 MED ORDER — FAMOTIDINE IN NACL 20-0.9 MG/50ML-% IV SOLN: 20.0000 mg | Freq: Once | INTRAVENOUS | Status: DC | PRN

## 2020-05-11 MED ORDER — DIPHENHYDRAMINE HCL 50 MG/ML IJ SOLN: 50.0000 mg | Freq: Once | INTRAMUSCULAR | Status: DC | PRN

## 2020-05-11 NOTE — Telephone Encounter (Signed)
Left message to return call 

## 2020-05-11 NOTE — Discharge Instructions (Signed)
Remdesivir What is this medicine? REMDESIVIR (rem DE si veer) is an antiviral medicine used to treat COVID-19. It will not work for colds, flu, or other viral infections. This medicine may be used for other purposes; ask your health care provider or pharmacist if you have questions. COMMON BRAND NAME(S): Marijean Heath What should I tell my health care provider before I take this medicine? They need to know if you have any of these conditions:  kidney disease  liver disease  an unusual or allergic reaction to remdesivir, other medicines, foods, dyes, or preservatives  pregnant or trying to get pregnant  breast-feeding How should I use this medicine? This medicine is for infusion into a vein. It is given by a health care professional in a hospital setting. Talk to your pediatrician regarding the use of this medicine in children. While this drug may be prescribed for selected conditions, precautions do apply. Overdosage: If you think you have taken too much of this medicine contact a poison control center or emergency room at once. NOTE: This medicine is only for you. Do not share this medicine with others. What if I miss a dose? This does not apply. What may interact with this medicine? Do not take this medicine with any of the following medications:  certain medicines for seizures like carbamazepine, phenobarbital, phenytoin, primidone  chloroquine  hydroxychloroquine  rifampin  rifapentine  St. John's wort This medicine may also interact with the following medications:  dexamethasone  eslicarbazepine  oxcarbazepine  rifabutin  rufinamide This list may not describe all possible interactions. Give your health care provider a list of all the medicines, herbs, non-prescription drugs, or dietary supplements you use. Also tell them if you smoke, drink alcohol, or use illegal drugs. Some items may interact with your medicine. What should I watch for while using this  medicine? Your condition will be monitored carefully while you are receiving this medicine. Visit your doctor or health care professional for regular check-ups. Tell your doctor if your symptoms do not start to get better or if they get worse. What side effects may I notice from receiving this medicine? Side effects that you should report to your doctor or health care professional as soon as possible:  allergic reactions like skin rash, itching or hives, swelling of the face, lips, or tongue  breathing problems  fast, irregular heartbeat Side effects that usually do not require medical attention (report these to your doctor or health care professional if they continue or are bothersome):  constipation  diarrhea  headache  nausea This list may not describe all possible side effects. Call your doctor for medical advice about side effects. You may report side effects to FDA at 1-800-FDA-1088. Where should I keep my medicine? This drug is given in a hospital and will not be stored at home. NOTE: This sheet is a summary. It may not cover all possible information. If you have questions about this medicine, talk to your doctor, pharmacist, or health care provider.  2020 Elsevier/Gold Standard (2019-03-02 09:51:25)

## 2020-05-11 NOTE — Progress Notes (Addendum)
  Diagnosis: COVID-19  Physician: Dr. Asencion Noble  Procedure: [facility] Infusion Clinic Med: casirivimab\imdevimab infusion - Provided patient with casirivimab\imdevimab fact sheet for patients, parents and caregivers prior to infusion.  Complications: No immediate complications noted.  Discharge: Discharged home   Randa Evens [facility] 05/11/2020

## 2020-05-11 NOTE — Telephone Encounter (Signed)
Nurse's-patient recently discharged from the hospital. Please call patient, let them know that we are aware that they were discharged from the hospital. Please schedule them to follow-up with [place] within the next 7 days. Advised the patient to bring all of their medications with him to the visit. Please inquire if they are having any acute issues currently and documented accordingly.  Please let the patient know that we were aware that she was in the hospital we are calling to arrange for her hospital follow-up-should be she be having any trouble to let us know  This patient was in the hospital with Covid pneumonia It was recommended for her to do a follow-up office visit 2 weeks after discharge.  Patient currently receiving outpatient infusions. She may do a in office hospital follow-up this follow-up should be somewhere between September 16 and September 21 with me Should she have any additional needs please let us let

## 2020-05-12 LAB — CULTURE, BLOOD (ROUTINE X 2)
Culture: NO GROWTH
Culture: NO GROWTH
Special Requests: ADEQUATE
Special Requests: ADEQUATE

## 2020-05-16 NOTE — Telephone Encounter (Signed)
Please schedule pt with Dr.Scott instead of Karen between 9/16 and 9/21. Thank you.

## 2020-05-18 NOTE — Telephone Encounter (Signed)
Schedule appointment 9/17

## 2020-05-25 ENCOUNTER — Other Ambulatory Visit: Payer: Self-pay

## 2020-05-25 ENCOUNTER — Ambulatory Visit (INDEPENDENT_AMBULATORY_CARE_PROVIDER_SITE_OTHER): Payer: BC Managed Care – PPO | Admitting: Family Medicine

## 2020-05-25 ENCOUNTER — Encounter: Payer: Self-pay | Admitting: Family Medicine

## 2020-05-25 VITALS — BP 104/68 | HR 118 | Temp 96.0°F | Wt 152.8 lb

## 2020-05-25 DIAGNOSIS — U071 COVID-19: Secondary | ICD-10-CM | POA: Diagnosis not present

## 2020-05-25 DIAGNOSIS — J988 Other specified respiratory disorders: Secondary | ICD-10-CM

## 2020-05-25 NOTE — Progress Notes (Addendum)
Patient ID: Stacey Rowe, female    DOB: [birth year], 59 y.o.   MRN: [id number]   Chief Complaint  Patient presents with  . Hospitalization Follow-up    Patient comes in today to follow up on hospitalization from covid. Patient seems to be feeling a little better this week. Started using 02 PRN in the last 2 days because of feeling light headed at times.    Subjective:  CC: hospital follow-up from Covid infection  HPI Diagnosed with covid on 8/20 and hospitalized on 8/30 with shortness of breath. saturations were 87% on arrival to ED. Weak  Still on oxygen at home.   Medical History Stacey Rowe has a past medical history of Arthritis, Cancer ([place]), Claustrophobia, Complication of anesthesia, Constipation, and PONV (postoperative nausea and vomiting).   Outpatient Encounter Medications as of 05/25/2020  Medication Sig  . acetaminophen (TYLENOL) 325 MG tablet Take 2 tablets (650 mg total) by mouth every 6 (six) hours as needed for mild pain or headache (fever >/= 101).  Marland Kitchen albuterol (PROAIR HFA) 108 (90 Base) MCG/ACT inhaler Inhale 2 puffs into the lungs every 4 (four) hours as needed for wheezing or shortness of breath.  Marland Kitchen ascorbic acid (VITAMIN C) 500 MG tablet Take 1 tablet (500 mg total) by mouth daily.  . cholecalciferol (VITAMIN D) 1000 units tablet Take 1,000 Units by mouth daily.  . Cyanocobalamin (B-12) 5000 MCG CAPS Take 1 tablet by mouth daily.  Marland Kitchen EPINEPHrine 0.3 mg/0.3 mL IJ SOAJ injection Inject into the muscle.  . Multiple Vitamin (MULTIVITAMIN WITH MINERALS) TABS tablet Take 1 tablet by mouth daily.  . naproxen sodium (ANAPROX) 220 MG tablet Take 440 mg by mouth daily as needed (pain).  . polyethylene glycol (MIRALAX / GLYCOLAX) packet Take 17 g by mouth daily as needed for mild constipation (takes once daily every day and 2nd time only if needed).  . valACYclovir (VALTREX) 1000 MG tablet Take 1,000-2,000 mg by mouth See admin instructions.   Marland Kitchen zinc sulfate 220 (50 Zn) MG  capsule Take 1 capsule (220 mg total) by mouth daily.  Marland Kitchen zolpidem (AMBIEN) 5 MG tablet Take 5 mg by mouth daily as needed for sleep.   . [DISCONTINUED] benzonatate (TESSALON) 100 MG capsule Take 1 capsule (100 mg total) by mouth 3 (three) times daily as needed for cough.  . [DISCONTINUED] famotidine (PEPCID) 20 MG tablet Take by mouth.  . [DISCONTINUED] Omega-3 Fatty Acids (FISH OIL PO) Take 1 capsule by mouth daily. (Patient not taking: Reported on 05/07/2020)   No facility-administered encounter medications on file as of 05/25/2020.     Review of Systems  Constitutional: Positive for fatigue. Negative for chills and fever.  Respiratory: Positive for cough, chest tightness and shortness of breath.        Left chest above left breast with tightness and still getting clear sputum up.  Gastrointestinal: Negative for diarrhea.  Neurological: Negative.      Vitals BP 104/68   Pulse (!) 118   Temp (!) 96 F (35.6 C)   Wt 152 lb 12.8 oz (69.3 kg)   SpO2 96%   BMI 24.66 kg/m   Objective:   Physical Exam Vitals and nursing note reviewed.  Constitutional:      Appearance: Normal appearance.  Cardiovascular:     Rate and Rhythm: Regular rhythm.     Heart sounds: Normal heart sounds.  Pulmonary:     Breath sounds: Normal breath sounds.     Comments: RR increased with talking  Skin:    General: Skin is warm and dry.  Neurological:     Mental Status: She is alert and oriented to person, place, and time.  Psychiatric:        Mood and Affect: Mood normal.        Behavior: Behavior normal.      Assessment and Plan   1. Respiratory tract infection due to COVID-19 virus   Tested positive for Covid on 8/20. On 8/30 started feeling short of breath and went to ED. Admitted for 3 days. Received 2 infusions. Home on oxygen. Taking oxygen saturations 92-95%. 96% in office today on room air.   Continue using oxygen and monitor saturations. Use albuterol inhaler as needed. Respiratory  rate increases with conversation. Monitor yourself and do not ignore any signs of hypoxia.  Reviewed chest x-ray results and discharge summary from recent hospitalization with patient in office today. Received the 2 scheduled infusions.   Agrees with plan of care discussed today. Understands warning signs to seek further care: oxygen < 90% and increasing shortness of breath that does not improve with rest.  Understands to follow-up in 3 weeks to see if shortness of breath is improving, sooner if anything changes. Understands to seek immediate care if her respiratory status declines. Inquired about receiving Covid vaccine and will do so once appropriate time has passed. Will decide on return to work date in 3 weeks depending on progress.   Pecolia Ades, FNP-C

## 2020-05-25 NOTE — Patient Instructions (Signed)
Continue to take it easy. Check oxygen levels frequently at home. Use oxygen as needed. Pay attention to yourself and don't ignore any signs of declining health.      COVID-19 COVID-19 is a respiratory infection that is caused by a virus called severe acute respiratory syndrome coronavirus 2 (SARS-CoV-2). The disease is also known as coronavirus disease or novel coronavirus. In some people, the virus may not cause any symptoms. In others, it may cause a serious infection. The infection can get worse quickly and can lead to complications, such as:  Pneumonia, or infection of the lungs.  Acute respiratory distress syndrome or ARDS. This is a condition in which fluid build-up in the lungs prevents the lungs from filling with air and passing oxygen into the blood.  Acute respiratory failure. This is a condition in which there is not enough oxygen passing from the lungs to the body or when carbon dioxide is not passing from the lungs out of the body.  Sepsis or septic shock. This is a serious bodily reaction to an infection.  Blood clotting problems.  Secondary infections due to bacteria or fungus.  Organ failure. This is when your body's organs stop working. The virus that causes COVID-19 is contagious. This means that it can spread from person to person through droplets from coughs and sneezes (respiratory secretions). What are the causes? This illness is caused by a virus. You may catch the virus by:  Breathing in droplets from an infected person. Droplets can be spread by a person breathing, speaking, singing, coughing, or sneezing.  Touching something, like a table or a doorknob, that was exposed to the virus (contaminated) and then touching your mouth, nose, or eyes. What increases the risk? Risk for infection You are more likely to be infected with this virus if you:  Are within 6 feet (2 meters) of a person with COVID-19.  Provide care for or live with a person who is infected  with COVID-19.  Spend time in crowded indoor spaces or live in shared housing. Risk for serious illness You are more likely to become seriously ill from the virus if you:  Are 9 years of age or older. The higher your age, the more you are at risk for serious illness.  Live in a nursing home or long-term care facility.  Have cancer.  Have a long-term (chronic) disease such as: ? Chronic lung disease, including chronic obstructive pulmonary disease or asthma. ? A long-term disease that lowers your body's ability to fight infection (immunocompromised). ? Heart disease, including heart failure, a condition in which the arteries that lead to the heart become narrow or blocked (coronary artery disease), a disease which makes the heart muscle thick, weak, or stiff (cardiomyopathy). ? Diabetes. ? Chronic kidney disease. ? Sickle cell disease, a condition in which red blood cells have an abnormal "sickle" shape. ? Liver disease.  Are obese. What are the signs or symptoms? Symptoms of this condition can range from mild to severe. Symptoms may appear any time from 2 to 14 days after being exposed to the virus. They include:  A fever or chills.  A cough.  Difficulty breathing.  Headaches, body aches, or muscle aches.  Runny or stuffy (congested) nose.  A sore throat.  New loss of taste or smell. Some people may also have stomach problems, such as nausea, vomiting, or diarrhea. Other people may not have any symptoms of COVID-19. How is this diagnosed? This condition may be diagnosed based on:  Your signs and symptoms, especially if: ? You live in an area with a COVID-19 outbreak. ? You recently traveled to or from an area where the virus is common. ? You provide care for or live with a person who was diagnosed with COVID-19. ? You were exposed to a person who was diagnosed with COVID-19.  A physical exam.  Lab tests, which may include: ? Taking a sample of fluid from the back  of your nose and throat (nasopharyngeal fluid), your nose, or your throat using a swab. ? A sample of mucus from your lungs (sputum). ? Blood tests.  Imaging tests, which may include, X-rays, CT scan, or ultrasound. How is this treated? At present, there is no medicine to treat COVID-19. Medicines that treat other diseases are being used on a trial basis to see if they are effective against COVID-19. Your health care provider will talk with you about ways to treat your symptoms. For most people, the infection is mild and can be managed at home with rest, fluids, and over-the-counter medicines. Treatment for a serious infection usually takes places in a hospital intensive care unit (ICU). It may include one or more of the following treatments. These treatments are given until your symptoms improve.  Receiving fluids and medicines through an IV.  Supplemental oxygen. Extra oxygen is given through a tube in the nose, a face mask, or a hood.  Positioning you to lie on your stomach (prone position). This makes it easier for oxygen to get into the lungs.  Continuous positive airway pressure (CPAP) or bi-level positive airway pressure (BPAP) machine. This treatment uses mild air pressure to keep the airways open. A tube that is connected to a motor delivers oxygen to the body.  Ventilator. This treatment moves air into and out of the lungs by using a tube that is placed in your windpipe.  Tracheostomy. This is a procedure to create a hole in the neck so that a breathing tube can be inserted.  Extracorporeal membrane oxygenation (ECMO). This procedure gives the lungs a chance to recover by taking over the functions of the heart and lungs. It supplies oxygen to the body and removes carbon dioxide. Follow these instructions at home: Lifestyle  If you are sick, stay home except to get medical care. Your health care provider will tell you how long to stay home. Call your health care provider before you  go for medical care.  Rest at home as told by your health care provider.  Do not use any products that contain nicotine or tobacco, such as cigarettes, e-cigarettes, and chewing tobacco. If you need help quitting, ask your health care provider.  Return to your normal activities as told by your health care provider. Ask your health care provider what activities are safe for you. General instructions  Take over-the-counter and prescription medicines only as told by your health care provider.  Drink enough fluid to keep your urine pale yellow.  Keep all follow-up visits as told by your health care provider. This is important. How is this prevented?  There is no vaccine to help prevent COVID-19 infection. However, there are steps you can take to protect yourself and others from this virus. To protect yourself:   Do not travel to areas where COVID-19 is a risk. The areas where COVID-19 is reported change often. To identify high-risk areas and travel restrictions, check the CDC travel website: FatFares.com.br  If you live in, or must travel to, an area where COVID-19  is a risk, take precautions to avoid infection. ? Stay away from people who are sick. ? Wash your hands often with soap and water for 20 seconds. If soap and water are not available, use an alcohol-based hand sanitizer. ? Avoid touching your mouth, face, eyes, or nose. ? Avoid going out in public, follow guidance from your state and local health authorities. ? If you must go out in public, wear a cloth face covering or face mask. Make sure your mask covers your nose and mouth. ? Avoid crowded indoor spaces. Stay at least 6 feet (2 meters) away from others. ? Disinfect objects and surfaces that are frequently touched every day. This may include:  Counters and tables.  Doorknobs and light switches.  Sinks and faucets.  Electronics, such as phones, remote controls, keyboards, computers, and tablets. To protect  others: If you have symptoms of COVID-19, take steps to prevent the virus from spreading to others.  If you think you have a COVID-19 infection, contact your health care provider right away. Tell your health care team that you think you may have a COVID-19 infection.  Stay home. Leave your house only to seek medical care. Do not use public transport.  Do not travel while you are sick.  Wash your hands often with soap and water for 20 seconds. If soap and water are not available, use alcohol-based hand sanitizer.  Stay away from other members of your household. Let healthy household members care for children and pets, if possible. If you have to care for children or pets, wash your hands often and wear a mask. If possible, stay in your own room, separate from others. Use a different bathroom.  Make sure that all people in your household wash their hands well and often.  Cough or sneeze into a tissue or your sleeve or elbow. Do not cough or sneeze into your hand or into the air.  Wear a cloth face covering or face mask. Make sure your mask covers your nose and mouth. Where to find more information  Centers for Disease Control and Prevention: PurpleGadgets.be  World Health Organization: https://www.castaneda.info/ Contact a health care provider if:  You live in or have traveled to an area where COVID-19 is a risk and you have symptoms of the infection.  You have had contact with someone who has COVID-19 and you have symptoms of the infection. Get help right away if:  You have trouble breathing.  You have pain or pressure in your chest.  You have confusion.  You have bluish lips and fingernails.  You have difficulty waking from sleep.  You have symptoms that get worse. These symptoms may represent a serious problem that is an emergency. Do not wait to see if the symptoms will go away. Get medical help right away. Call your local emergency  services (911 in the [place] Do not drive yourself to the hospital. Let the emergency medical personnel know if you think you have COVID-19. Summary  COVID-19 is a respiratory infection that is caused by a virus. It is also known as coronavirus disease or novel coronavirus. It can cause serious infections, such as pneumonia, acute respiratory distress syndrome, acute respiratory failure, or sepsis.  The virus that causes COVID-19 is contagious. This means that it can spread from person to person through droplets from breathing, speaking, singing, coughing, or sneezing.  You are more likely to develop a serious illness if you are 56 years of age or older, have a weak immune  system, live in a nursing home, or have chronic disease.  There is no medicine to treat COVID-19. Your health care provider will talk with you about ways to treat your symptoms.  Take steps to protect yourself and others from infection. Wash your hands often and disinfect objects and surfaces that are frequently touched every day. Stay away from people who are sick and wear a mask if you are sick. This information is not intended to replace advice given to you by your health care provider. Make sure you discuss any questions you have with your health care provider. Document Revised: 06/24/2019 Document Reviewed: 09/30/2018 Elsevier Patient Education  [place].

## 2020-06-05 DIAGNOSIS — Z6824 Body mass index (BMI) 24.0-24.9, adult: Secondary | ICD-10-CM | POA: Diagnosis not present

## 2020-06-05 DIAGNOSIS — Z01419 Encounter for gynecological examination (general) (routine) without abnormal findings: Secondary | ICD-10-CM | POA: Diagnosis not present

## 2020-06-08 DIAGNOSIS — J9601 Acute respiratory failure with hypoxia: Secondary | ICD-10-CM | POA: Diagnosis not present

## 2020-06-08 DIAGNOSIS — J9600 Acute respiratory failure, unspecified whether with hypoxia or hypercapnia: Secondary | ICD-10-CM | POA: Diagnosis not present

## 2020-06-08 DIAGNOSIS — U071 COVID-19: Secondary | ICD-10-CM | POA: Diagnosis not present

## 2020-06-19 ENCOUNTER — Encounter: Payer: Self-pay | Admitting: Family Medicine

## 2020-06-19 ENCOUNTER — Ambulatory Visit (INDEPENDENT_AMBULATORY_CARE_PROVIDER_SITE_OTHER): Payer: BC Managed Care – PPO | Admitting: Family Medicine

## 2020-06-19 ENCOUNTER — Telehealth: Payer: Self-pay | Admitting: *Deleted

## 2020-06-19 VITALS — BP 118/76 | HR 99 | Temp 97.2°F | Wt 157.0 lb

## 2020-06-19 DIAGNOSIS — Z8616 Personal history of COVID-19: Secondary | ICD-10-CM | POA: Diagnosis not present

## 2020-06-19 NOTE — Progress Notes (Signed)
Patient ID: Stacey Rowe, female    DOB: Feb 15, 1961, 59 y.o.   MRN: [id number]   Chief Complaint  Patient presents with  . Follow-up    Patient comes in to follow up after a respiratory infection from covid. Patient reports improvement in headaches and coughing. Still having some memory issues and getting fatigued after minimal activity. No oxygen use in 6 days.    Subjective:  CC: follow-up from Covid infection  Stacey Rowe is here today for follow-up from her August 30 through September 1 hospital admission with Covid pneumonia.  She was treated appropriately during that admission, and discharged home on oxygen therapy.  She followed  up with me on September 17 and now today.  She reports that she is doing much better.  Has not used her oxygen in 6 days.  Denies fever,  Walking without shortness of breath, and tires easily.  Overall she is much improved.  She looks good today.    Medical History Stacey Rowe has a past medical history of Arthritis, Cancer ([employer]), Claustrophobia, Complication of anesthesia, Constipation, and PONV (postoperative nausea and vomiting).   Outpatient Encounter Medications as of 06/19/2020  Medication Sig  . acetaminophen (TYLENOL) 325 MG tablet Take 2 tablets (650 mg total) by mouth every 6 (six) hours as needed for mild pain or headache (fever >/= 101).  Marland Kitchen albuterol (PROAIR HFA) 108 (90 Base) MCG/ACT inhaler Inhale 2 puffs into the lungs every 4 (four) hours as needed for wheezing or shortness of breath.  Marland Kitchen ascorbic acid (VITAMIN C) 500 MG tablet Take 1 tablet (500 mg total) by mouth daily.  . cholecalciferol (VITAMIN D) 1000 units tablet Take 1,000 Units by mouth daily.  . Cyanocobalamin (B-12) 5000 MCG CAPS Take 1 tablet by mouth daily.  Marland Kitchen EPINEPHrine 0.3 mg/0.3 mL IJ SOAJ injection Inject into the muscle.  . Multiple Vitamin (MULTIVITAMIN WITH MINERALS) TABS tablet Take 1 tablet by mouth daily.  . naproxen sodium (ANAPROX) 220 MG tablet Take 440 mg by mouth  daily as needed (pain).  . polyethylene glycol (MIRALAX / GLYCOLAX) packet Take 17 g by mouth daily as needed for mild constipation (takes once daily every day and 2nd time only if needed).  . valACYclovir (VALTREX) 1000 MG tablet Take 1,000-2,000 mg by mouth See admin instructions.   Marland Kitchen zinc sulfate 220 (50 Zn) MG capsule Take 1 capsule (220 mg total) by mouth daily.  Marland Kitchen zolpidem (AMBIEN) 5 MG tablet Take 5 mg by mouth daily as needed for sleep.    No facility-administered encounter medications on file as of 06/19/2020.     Review of Systems  Constitutional: Positive for fatigue. Negative for fever.  HENT: Negative.        Head felt "full" yesterday.  Eyes: Negative.   Respiratory: Negative for shortness of breath.        With walking, able to converse with me during the visit.   Cardiovascular: Negative for chest pain.  Neurological: Positive for headaches.       Occassionally, not as bad as when she had Covid.   Hematological: Negative for adenopathy.     Vitals BP 118/76   Pulse 99   Temp (!) 97.2 F (36.2 C)   Wt 157 lb (71.2 kg)   SpO2 97%   BMI 25.34 kg/m   Objective:   Physical Exam Vitals and nursing note reviewed.  Constitutional:      Appearance: Normal appearance.  Cardiovascular:     Rate and Rhythm:  Normal rate and regular rhythm.     Heart sounds: Normal heart sounds.  Pulmonary:     Effort: Pulmonary effort is normal.     Breath sounds: Normal breath sounds.  Skin:    General: Skin is warm and dry.  Neurological:     General: No focal deficit present.     Mental Status: She is alert and oriented to person, place, and time.  Psychiatric:        Mood and Affect: Mood normal.        Behavior: Behavior normal.        Thought Content: Thought content normal.        Judgment: Judgment normal.      Assessment and Plan   1. History of COVID-19   Ulani returns today for follow-up after her Covid infection.  She is much improved, she has not used  oxygen for 6 days now.  She wishes for [place] to fax to the oxygen company and cancel this for her.  She is trying to walk more, and she is not experiencing shortness of breath.  She is getting tired and fatigued easily, and resting much more than normal. She has noticed that her memory is not as good as it once was, states that things just go very quickly.  She reports since yesterday her head feels "full "and she is only coughing about once a day now. She is without fever and does not feel bad.  She feels she is much improved. Agrees with plan of care discussed today. Understands warning signs to seek further care: Fever, shortness of breath, confusion, any signs or symptoms that are concerning to her. Understands to follow-up if her symptoms return, or if anything changes.

## 2020-06-19 NOTE — Telephone Encounter (Signed)
Patient seen in the office today. Per karen, we need to fax a letter with our letterhead and the patients info stating that patient no longer needs oxygen and it can be cancelled. This needs to be faxed to [employer] at [phone]

## 2020-06-22 ENCOUNTER — Encounter: Payer: Self-pay | Admitting: Family Medicine

## 2020-06-22 ENCOUNTER — Telehealth: Payer: Self-pay | Admitting: Family Medicine

## 2020-06-22 NOTE — Telephone Encounter (Signed)
Pt called into office inquiring about letter that was to be faxed to [facility] regarding her oxygen. Pt was seen on 06/19/20 and [facility] [place] approved letter to discontinue oxygen. Letter typed up by front, will fax and let pt know.

## 2020-06-22 NOTE — Telephone Encounter (Signed)
Letter typed and faxed to [facility] x 2 with fax confirmation on both.

## 2020-06-26 DIAGNOSIS — K5900 Constipation, unspecified: Secondary | ICD-10-CM | POA: Diagnosis not present

## 2020-06-26 DIAGNOSIS — Z1211 Encounter for screening for malignant neoplasm of colon: Secondary | ICD-10-CM | POA: Diagnosis not present

## 2020-06-29 ENCOUNTER — Telehealth: Payer: Self-pay | Admitting: *Deleted

## 2020-06-29 DIAGNOSIS — Z1231 Encounter for screening mammogram for malignant neoplasm of breast: Secondary | ICD-10-CM | POA: Diagnosis not present

## 2020-06-29 NOTE — Telephone Encounter (Signed)
Patient was here last week and requested a letter be faxed to [employer] to discontinue her oxygen. Patient states she was given the wrong fax number. Letter needs to be faxed to [phone]. She said she called earlier in the week to have this done but I didn't see anything and wanted to check. She would like a call back to let her know its been faxed.

## 2020-07-02 NOTE — Telephone Encounter (Signed)
Nurse faxed over letter today.

## 2020-07-11 DIAGNOSIS — D125 Benign neoplasm of sigmoid colon: Secondary | ICD-10-CM | POA: Diagnosis not present

## 2020-07-11 DIAGNOSIS — K635 Polyp of colon: Secondary | ICD-10-CM | POA: Diagnosis not present

## 2020-07-11 DIAGNOSIS — D122 Benign neoplasm of ascending colon: Secondary | ICD-10-CM | POA: Diagnosis not present

## 2020-07-11 DIAGNOSIS — Z1211 Encounter for screening for malignant neoplasm of colon: Secondary | ICD-10-CM | POA: Diagnosis not present

## 2022-11-11 ENCOUNTER — Other Ambulatory Visit: Payer: Self-pay | Admitting: Family Medicine

## 2022-11-11 DIAGNOSIS — Z1231 Encounter for screening mammogram for malignant neoplasm of breast: Secondary | ICD-10-CM
